# Patient Record
Sex: Male | Born: 1950 | Race: White | Hispanic: No | Smoking: Current every day smoker
Health system: Southern US, Community
[De-identification: ages and names within clinical notes are randomized; demographics above are authoritative.]

## PROBLEM LIST (undated history)

## (undated) DIAGNOSIS — J449 Chronic obstructive pulmonary disease, unspecified: Secondary | ICD-10-CM

## (undated) DIAGNOSIS — K449 Diaphragmatic hernia without obstruction or gangrene: Secondary | ICD-10-CM

## (undated) DIAGNOSIS — M5030 Other cervical disc degeneration, unspecified cervical region: Secondary | ICD-10-CM

## (undated) DIAGNOSIS — I10 Essential (primary) hypertension: Secondary | ICD-10-CM

## (undated) DIAGNOSIS — G40909 Epilepsy, unspecified, not intractable, without status epilepticus: Secondary | ICD-10-CM

## (undated) DIAGNOSIS — D649 Anemia, unspecified: Secondary | ICD-10-CM

## (undated) DIAGNOSIS — G2581 Restless legs syndrome: Secondary | ICD-10-CM

## (undated) DIAGNOSIS — M545 Low back pain, unspecified: Secondary | ICD-10-CM

## (undated) DIAGNOSIS — E871 Hypo-osmolality and hyponatremia: Secondary | ICD-10-CM

## (undated) DIAGNOSIS — K219 Gastro-esophageal reflux disease without esophagitis: Secondary | ICD-10-CM

## (undated) DIAGNOSIS — R159 Full incontinence of feces: Secondary | ICD-10-CM

## (undated) DIAGNOSIS — R569 Unspecified convulsions: Secondary | ICD-10-CM

## (undated) DIAGNOSIS — G459 Transient cerebral ischemic attack, unspecified: Secondary | ICD-10-CM

## (undated) DIAGNOSIS — F1010 Alcohol abuse, uncomplicated: Secondary | ICD-10-CM

## (undated) HISTORY — DX: Other cervical disc degeneration, unspecified cervical region: M50.30

## (undated) HISTORY — DX: Chronic obstructive pulmonary disease, unspecified: J44.9

## (undated) HISTORY — DX: Anemia, unspecified: D64.9

## (undated) HISTORY — DX: Low back pain: M54.5

## (undated) HISTORY — DX: Diaphragmatic hernia without obstruction or gangrene: K44.9

## (undated) HISTORY — PX: LUMBAR LAMINECTOMY/DECOMPRESSION MICRODISCECTOMY: SHX5026

## (undated) HISTORY — PX: BRAIN SURGERY: SHX531

## (undated) HISTORY — DX: Unspecified convulsions: R56.9

## (undated) HISTORY — DX: Gastro-esophageal reflux disease without esophagitis: K21.9

## (undated) HISTORY — DX: Hypo-osmolality and hyponatremia: E87.1

## (undated) HISTORY — DX: Full incontinence of feces: R15.9

## (undated) HISTORY — DX: Alcohol abuse, uncomplicated: F10.10

## (undated) HISTORY — PX: ROTATOR CUFF REPAIR: SHX139

## (undated) HISTORY — DX: Transient cerebral ischemic attack, unspecified: G45.9

## (undated) HISTORY — DX: Epilepsy, unspecified, not intractable, without status epilepticus: G40.909

## (undated) HISTORY — DX: Low back pain, unspecified: M54.50

## (undated) HISTORY — DX: Essential (primary) hypertension: I10

## (undated) HISTORY — DX: Restless legs syndrome: G25.81

---

## 1995-02-28 HISTORY — PX: INGUINAL HERNIA REPAIR: SUR1180

## 1997-04-12 ENCOUNTER — Ambulatory Visit (HOSPITAL_COMMUNITY): Admission: RE | Admit: 1997-04-12 | Discharge: 1997-04-12 | Payer: Self-pay | Admitting: Family Medicine

## 1997-04-22 ENCOUNTER — Ambulatory Visit (HOSPITAL_COMMUNITY): Admission: RE | Admit: 1997-04-22 | Discharge: 1997-04-22 | Payer: Self-pay | Admitting: Family Medicine

## 1997-06-19 ENCOUNTER — Other Ambulatory Visit: Admission: RE | Admit: 1997-06-19 | Discharge: 1997-06-19 | Payer: Self-pay | Admitting: Family Medicine

## 1997-09-14 ENCOUNTER — Inpatient Hospital Stay (HOSPITAL_COMMUNITY): Admission: EM | Admit: 1997-09-14 | Discharge: 1997-09-16 | Payer: Self-pay | Admitting: Emergency Medicine

## 1997-10-07 ENCOUNTER — Ambulatory Visit (HOSPITAL_COMMUNITY): Admission: RE | Admit: 1997-10-07 | Discharge: 1997-10-07 | Payer: Self-pay | Admitting: *Deleted

## 1998-01-20 ENCOUNTER — Encounter: Payer: Self-pay | Admitting: Family Medicine

## 1998-01-20 ENCOUNTER — Ambulatory Visit (HOSPITAL_COMMUNITY): Admission: RE | Admit: 1998-01-20 | Discharge: 1998-01-20 | Payer: Self-pay | Admitting: Family Medicine

## 1998-02-06 ENCOUNTER — Ambulatory Visit (HOSPITAL_COMMUNITY): Admission: RE | Admit: 1998-02-06 | Discharge: 1998-02-06 | Payer: Self-pay | Admitting: Internal Medicine

## 1998-02-06 ENCOUNTER — Encounter: Payer: Self-pay | Admitting: Internal Medicine

## 1998-03-16 ENCOUNTER — Ambulatory Visit (HOSPITAL_COMMUNITY): Admission: RE | Admit: 1998-03-16 | Discharge: 1998-03-16 | Payer: Self-pay | Admitting: Family Medicine

## 1998-03-16 ENCOUNTER — Encounter: Payer: Self-pay | Admitting: Family Medicine

## 1998-03-26 ENCOUNTER — Ambulatory Visit (HOSPITAL_COMMUNITY): Admission: RE | Admit: 1998-03-26 | Discharge: 1998-03-26 | Payer: Self-pay | Admitting: Family Medicine

## 1998-03-26 ENCOUNTER — Encounter: Payer: Self-pay | Admitting: Family Medicine

## 1998-04-21 ENCOUNTER — Encounter: Admission: RE | Admit: 1998-04-21 | Discharge: 1998-07-20 | Payer: Self-pay

## 1999-01-16 ENCOUNTER — Ambulatory Visit (HOSPITAL_COMMUNITY): Admission: RE | Admit: 1999-01-16 | Discharge: 1999-01-16 | Payer: Self-pay | Admitting: Internal Medicine

## 1999-01-16 ENCOUNTER — Encounter: Payer: Self-pay | Admitting: Internal Medicine

## 1999-01-19 ENCOUNTER — Ambulatory Visit (HOSPITAL_COMMUNITY): Admission: RE | Admit: 1999-01-19 | Discharge: 1999-01-19 | Payer: Self-pay | Admitting: *Deleted

## 1999-01-23 ENCOUNTER — Ambulatory Visit (HOSPITAL_COMMUNITY): Admission: RE | Admit: 1999-01-23 | Discharge: 1999-01-23 | Payer: Self-pay | Admitting: *Deleted

## 1999-02-03 ENCOUNTER — Encounter: Admission: RE | Admit: 1999-02-03 | Discharge: 1999-02-23 | Payer: Self-pay | Admitting: Orthopedic Surgery

## 1999-02-28 ENCOUNTER — Emergency Department (HOSPITAL_COMMUNITY): Admission: EM | Admit: 1999-02-28 | Discharge: 1999-02-28 | Payer: Self-pay | Admitting: Emergency Medicine

## 1999-02-28 ENCOUNTER — Encounter: Payer: Self-pay | Admitting: Emergency Medicine

## 2001-06-17 ENCOUNTER — Emergency Department (HOSPITAL_COMMUNITY): Admission: EM | Admit: 2001-06-17 | Discharge: 2001-06-17 | Payer: Self-pay | Admitting: Emergency Medicine

## 2002-03-04 ENCOUNTER — Encounter: Payer: Self-pay | Admitting: Family Medicine

## 2002-03-04 ENCOUNTER — Ambulatory Visit (HOSPITAL_COMMUNITY): Admission: RE | Admit: 2002-03-04 | Discharge: 2002-03-04 | Payer: Self-pay | Admitting: Family Medicine

## 2002-04-17 ENCOUNTER — Ambulatory Visit (HOSPITAL_COMMUNITY): Admission: RE | Admit: 2002-04-17 | Discharge: 2002-04-17 | Payer: Self-pay | Admitting: Family Medicine

## 2002-04-23 ENCOUNTER — Encounter: Payer: Self-pay | Admitting: Neurological Surgery

## 2002-04-23 ENCOUNTER — Ambulatory Visit (HOSPITAL_COMMUNITY): Admission: RE | Admit: 2002-04-23 | Discharge: 2002-04-24 | Payer: Self-pay | Admitting: Neurological Surgery

## 2002-05-31 ENCOUNTER — Ambulatory Visit (HOSPITAL_COMMUNITY): Admission: RE | Admit: 2002-05-31 | Discharge: 2002-05-31 | Payer: Self-pay | Admitting: Family Medicine

## 2002-07-16 ENCOUNTER — Encounter: Payer: Self-pay | Admitting: Neurological Surgery

## 2002-07-16 ENCOUNTER — Ambulatory Visit (HOSPITAL_COMMUNITY): Admission: RE | Admit: 2002-07-16 | Discharge: 2002-07-16 | Payer: Self-pay | Admitting: Neurological Surgery

## 2003-01-17 ENCOUNTER — Ambulatory Visit (HOSPITAL_COMMUNITY): Admission: RE | Admit: 2003-01-17 | Discharge: 2003-01-17 | Payer: Self-pay | Admitting: Neurological Surgery

## 2003-02-15 ENCOUNTER — Encounter: Admission: RE | Admit: 2003-02-15 | Discharge: 2003-02-15 | Payer: Self-pay | Admitting: Neurological Surgery

## 2003-04-24 ENCOUNTER — Emergency Department (HOSPITAL_COMMUNITY): Admission: EM | Admit: 2003-04-24 | Discharge: 2003-04-24 | Payer: Self-pay | Admitting: Emergency Medicine

## 2003-05-16 ENCOUNTER — Ambulatory Visit (HOSPITAL_COMMUNITY): Admission: RE | Admit: 2003-05-16 | Discharge: 2003-05-16 | Payer: Self-pay | Admitting: Neurological Surgery

## 2003-06-24 ENCOUNTER — Emergency Department (HOSPITAL_COMMUNITY): Admission: EM | Admit: 2003-06-24 | Discharge: 2003-06-24 | Payer: Self-pay | Admitting: Emergency Medicine

## 2003-10-28 ENCOUNTER — Ambulatory Visit: Payer: Self-pay | Admitting: Family Medicine

## 2003-11-12 ENCOUNTER — Ambulatory Visit: Payer: Self-pay | Admitting: Critical Care Medicine

## 2003-11-13 ENCOUNTER — Ambulatory Visit (HOSPITAL_BASED_OUTPATIENT_CLINIC_OR_DEPARTMENT_OTHER): Admission: RE | Admit: 2003-11-13 | Discharge: 2003-11-13 | Payer: Self-pay | Admitting: Family Medicine

## 2003-11-15 ENCOUNTER — Ambulatory Visit (HOSPITAL_COMMUNITY): Admission: RE | Admit: 2003-11-15 | Discharge: 2003-11-15 | Payer: Self-pay | Admitting: Family Medicine

## 2003-12-10 ENCOUNTER — Ambulatory Visit: Payer: Self-pay | Admitting: Family Medicine

## 2003-12-26 ENCOUNTER — Ambulatory Visit: Payer: Self-pay | Admitting: Critical Care Medicine

## 2004-03-26 ENCOUNTER — Ambulatory Visit: Payer: Self-pay | Admitting: Critical Care Medicine

## 2004-05-27 ENCOUNTER — Ambulatory Visit: Payer: Self-pay | Admitting: Critical Care Medicine

## 2004-06-17 ENCOUNTER — Ambulatory Visit: Payer: Self-pay | Admitting: Internal Medicine

## 2004-06-18 ENCOUNTER — Ambulatory Visit (HOSPITAL_COMMUNITY): Admission: RE | Admit: 2004-06-18 | Discharge: 2004-06-18 | Payer: Self-pay | Admitting: Internal Medicine

## 2004-06-30 ENCOUNTER — Ambulatory Visit: Payer: Self-pay | Admitting: Internal Medicine

## 2004-07-29 ENCOUNTER — Ambulatory Visit: Payer: Self-pay | Admitting: Critical Care Medicine

## 2004-08-04 ENCOUNTER — Ambulatory Visit: Payer: Self-pay | Admitting: Family Medicine

## 2004-08-26 ENCOUNTER — Ambulatory Visit (HOSPITAL_COMMUNITY): Admission: RE | Admit: 2004-08-26 | Discharge: 2004-08-26 | Payer: Self-pay | Admitting: Neurological Surgery

## 2004-09-09 ENCOUNTER — Ambulatory Visit: Payer: Self-pay | Admitting: Critical Care Medicine

## 2004-09-28 ENCOUNTER — Ambulatory Visit: Payer: Self-pay | Admitting: Physical Medicine & Rehabilitation

## 2004-09-28 ENCOUNTER — Inpatient Hospital Stay (HOSPITAL_COMMUNITY): Admission: RE | Admit: 2004-09-28 | Discharge: 2004-10-05 | Payer: Self-pay | Admitting: Neurological Surgery

## 2004-10-02 ENCOUNTER — Ambulatory Visit: Payer: Self-pay | Admitting: Critical Care Medicine

## 2004-11-09 ENCOUNTER — Ambulatory Visit: Payer: Self-pay | Admitting: Critical Care Medicine

## 2005-02-16 ENCOUNTER — Ambulatory Visit: Payer: Self-pay | Admitting: Critical Care Medicine

## 2005-03-01 ENCOUNTER — Ambulatory Visit: Payer: Self-pay | Admitting: Family Medicine

## 2005-03-10 ENCOUNTER — Ambulatory Visit: Payer: Self-pay | Admitting: Family Medicine

## 2005-04-12 ENCOUNTER — Ambulatory Visit: Payer: Self-pay | Admitting: Family Medicine

## 2005-05-17 ENCOUNTER — Ambulatory Visit: Payer: Self-pay | Admitting: Critical Care Medicine

## 2005-07-09 ENCOUNTER — Ambulatory Visit: Payer: Self-pay | Admitting: Family Medicine

## 2005-07-16 ENCOUNTER — Ambulatory Visit: Payer: Self-pay | Admitting: Critical Care Medicine

## 2005-07-27 ENCOUNTER — Ambulatory Visit: Payer: Self-pay | Admitting: Family Medicine

## 2005-07-30 ENCOUNTER — Ambulatory Visit: Payer: Self-pay | Admitting: Critical Care Medicine

## 2005-08-12 ENCOUNTER — Ambulatory Visit: Payer: Self-pay | Admitting: Family Medicine

## 2005-08-24 ENCOUNTER — Ambulatory Visit: Payer: Self-pay | Admitting: Family Medicine

## 2005-09-27 ENCOUNTER — Ambulatory Visit: Payer: Self-pay | Admitting: Cardiovascular Disease

## 2005-09-28 ENCOUNTER — Ambulatory Visit: Payer: Self-pay | Admitting: Critical Care Medicine

## 2005-10-05 ENCOUNTER — Ambulatory Visit: Payer: Self-pay

## 2005-10-20 ENCOUNTER — Ambulatory Visit: Payer: Self-pay | Admitting: Critical Care Medicine

## 2005-11-03 ENCOUNTER — Ambulatory Visit: Payer: Self-pay | Admitting: Critical Care Medicine

## 2005-12-30 ENCOUNTER — Ambulatory Visit: Payer: Self-pay | Admitting: Critical Care Medicine

## 2006-03-30 ENCOUNTER — Ambulatory Visit: Payer: Self-pay | Admitting: Critical Care Medicine

## 2006-05-09 ENCOUNTER — Ambulatory Visit: Payer: Self-pay | Admitting: Family Medicine

## 2006-07-13 ENCOUNTER — Ambulatory Visit: Payer: Self-pay | Admitting: Critical Care Medicine

## 2006-08-20 ENCOUNTER — Encounter: Admission: RE | Admit: 2006-08-20 | Discharge: 2006-08-20 | Payer: Self-pay | Admitting: Neurological Surgery

## 2006-08-31 ENCOUNTER — Encounter (INDEPENDENT_AMBULATORY_CARE_PROVIDER_SITE_OTHER): Payer: Self-pay | Admitting: Family Medicine

## 2006-09-15 ENCOUNTER — Ambulatory Visit: Payer: Self-pay | Admitting: Critical Care Medicine

## 2006-09-15 DIAGNOSIS — Z9889 Other specified postprocedural states: Secondary | ICD-10-CM

## 2006-09-15 DIAGNOSIS — J42 Unspecified chronic bronchitis: Secondary | ICD-10-CM | POA: Insufficient documentation

## 2006-09-15 DIAGNOSIS — Z8679 Personal history of other diseases of the circulatory system: Secondary | ICD-10-CM | POA: Insufficient documentation

## 2006-09-15 DIAGNOSIS — M810 Age-related osteoporosis without current pathological fracture: Secondary | ICD-10-CM | POA: Insufficient documentation

## 2006-09-15 DIAGNOSIS — H60509 Unspecified acute noninfective otitis externa, unspecified ear: Secondary | ICD-10-CM

## 2006-09-15 DIAGNOSIS — K219 Gastro-esophageal reflux disease without esophagitis: Secondary | ICD-10-CM | POA: Insufficient documentation

## 2006-09-15 DIAGNOSIS — G40909 Epilepsy, unspecified, not intractable, without status epilepticus: Secondary | ICD-10-CM

## 2006-09-15 DIAGNOSIS — F1021 Alcohol dependence, in remission: Secondary | ICD-10-CM

## 2006-09-15 DIAGNOSIS — K449 Diaphragmatic hernia without obstruction or gangrene: Secondary | ICD-10-CM | POA: Insufficient documentation

## 2006-09-15 DIAGNOSIS — I10 Essential (primary) hypertension: Secondary | ICD-10-CM

## 2006-09-15 DIAGNOSIS — G2581 Restless legs syndrome: Secondary | ICD-10-CM | POA: Insufficient documentation

## 2006-09-15 DIAGNOSIS — J449 Chronic obstructive pulmonary disease, unspecified: Secondary | ICD-10-CM

## 2006-09-16 DIAGNOSIS — M545 Low back pain: Secondary | ICD-10-CM

## 2006-10-10 ENCOUNTER — Inpatient Hospital Stay (HOSPITAL_COMMUNITY): Admission: RE | Admit: 2006-10-10 | Discharge: 2006-10-14 | Payer: Self-pay | Admitting: Neurological Surgery

## 2006-10-14 ENCOUNTER — Telehealth (INDEPENDENT_AMBULATORY_CARE_PROVIDER_SITE_OTHER): Payer: Self-pay | Admitting: *Deleted

## 2006-10-24 ENCOUNTER — Ambulatory Visit: Payer: Self-pay | Admitting: Critical Care Medicine

## 2006-10-27 ENCOUNTER — Encounter (INDEPENDENT_AMBULATORY_CARE_PROVIDER_SITE_OTHER): Payer: Self-pay | Admitting: Family Medicine

## 2006-11-04 ENCOUNTER — Encounter (INDEPENDENT_AMBULATORY_CARE_PROVIDER_SITE_OTHER): Payer: Self-pay | Admitting: Family Medicine

## 2006-11-07 ENCOUNTER — Ambulatory Visit (HOSPITAL_COMMUNITY): Admission: RE | Admit: 2006-11-07 | Discharge: 2006-11-07 | Payer: Self-pay | Admitting: Neurological Surgery

## 2006-11-07 ENCOUNTER — Ambulatory Visit: Payer: Self-pay | Admitting: Surgery

## 2006-11-07 ENCOUNTER — Encounter (INDEPENDENT_AMBULATORY_CARE_PROVIDER_SITE_OTHER): Payer: Self-pay | Admitting: Neurological Surgery

## 2006-11-09 ENCOUNTER — Telehealth (INDEPENDENT_AMBULATORY_CARE_PROVIDER_SITE_OTHER): Payer: Self-pay | Admitting: Family Medicine

## 2006-11-10 ENCOUNTER — Encounter (INDEPENDENT_AMBULATORY_CARE_PROVIDER_SITE_OTHER): Payer: Self-pay | Admitting: Family Medicine

## 2006-11-25 ENCOUNTER — Encounter (INDEPENDENT_AMBULATORY_CARE_PROVIDER_SITE_OTHER): Payer: Self-pay | Admitting: Family Medicine

## 2006-12-23 ENCOUNTER — Encounter (INDEPENDENT_AMBULATORY_CARE_PROVIDER_SITE_OTHER): Payer: Self-pay | Admitting: Family Medicine

## 2007-01-31 ENCOUNTER — Ambulatory Visit: Payer: Self-pay | Admitting: Critical Care Medicine

## 2007-02-07 ENCOUNTER — Encounter (INDEPENDENT_AMBULATORY_CARE_PROVIDER_SITE_OTHER): Payer: Self-pay | Admitting: Family Medicine

## 2007-02-17 ENCOUNTER — Telehealth (INDEPENDENT_AMBULATORY_CARE_PROVIDER_SITE_OTHER): Payer: Self-pay | Admitting: Nurse Practitioner

## 2007-02-27 ENCOUNTER — Telehealth (INDEPENDENT_AMBULATORY_CARE_PROVIDER_SITE_OTHER): Payer: Self-pay | Admitting: Family Medicine

## 2007-03-01 ENCOUNTER — Encounter (INDEPENDENT_AMBULATORY_CARE_PROVIDER_SITE_OTHER): Payer: Self-pay | Admitting: Family Medicine

## 2007-04-11 ENCOUNTER — Ambulatory Visit: Payer: Self-pay | Admitting: Critical Care Medicine

## 2007-04-12 ENCOUNTER — Encounter (INDEPENDENT_AMBULATORY_CARE_PROVIDER_SITE_OTHER): Payer: Self-pay | Admitting: Family Medicine

## 2007-04-27 ENCOUNTER — Encounter: Admission: RE | Admit: 2007-04-27 | Discharge: 2007-04-27 | Payer: Self-pay | Admitting: Neurological Surgery

## 2007-05-22 ENCOUNTER — Ambulatory Visit: Payer: Self-pay | Admitting: Family Medicine

## 2007-05-22 DIAGNOSIS — R159 Full incontinence of feces: Secondary | ICD-10-CM | POA: Insufficient documentation

## 2007-05-22 LAB — CONVERTED CEMR LAB
ALT: 11 units/L (ref 0–53)
Albumin: 4.2 g/dL (ref 3.5–5.2)
Basophils Absolute: 0 10*3/uL (ref 0.0–0.1)
CO2: 23 meq/L (ref 19–32)
Chloride: 97 meq/L (ref 96–112)
Eosinophils Relative: 4 % (ref 0–5)
Glucose, Bld: 72 mg/dL (ref 70–99)
HCT: 39.8 % (ref 39.0–52.0)
LDL Cholesterol: 107 mg/dL — ABNORMAL HIGH (ref 0–99)
Lymphs Abs: 2.1 10*3/uL (ref 0.7–4.0)
Monocytes Absolute: 1.4 10*3/uL — ABNORMAL HIGH (ref 0.1–1.0)
Monocytes Relative: 16 % — ABNORMAL HIGH (ref 3–12)
Neutrophils Relative %: 55 % (ref 43–77)
Phenytoin Lvl: 11.8 ug/mL (ref 10.0–20.0)
TSH: 1.812 microintl units/mL (ref 0.350–5.50)
Total CHOL/HDL Ratio: 3.1
Triglycerides: 56 mg/dL (ref <150)
VLDL: 11 mg/dL (ref 0–40)

## 2007-05-24 ENCOUNTER — Telehealth (INDEPENDENT_AMBULATORY_CARE_PROVIDER_SITE_OTHER): Payer: Self-pay | Admitting: *Deleted

## 2007-05-26 ENCOUNTER — Encounter (INDEPENDENT_AMBULATORY_CARE_PROVIDER_SITE_OTHER): Payer: Self-pay | Admitting: Family Medicine

## 2007-05-30 ENCOUNTER — Telehealth (INDEPENDENT_AMBULATORY_CARE_PROVIDER_SITE_OTHER): Payer: Self-pay | Admitting: Family Medicine

## 2007-06-08 ENCOUNTER — Telehealth (INDEPENDENT_AMBULATORY_CARE_PROVIDER_SITE_OTHER): Payer: Self-pay | Admitting: Family Medicine

## 2007-06-13 ENCOUNTER — Encounter (INDEPENDENT_AMBULATORY_CARE_PROVIDER_SITE_OTHER): Payer: Self-pay | Admitting: Family Medicine

## 2007-06-29 ENCOUNTER — Encounter (INDEPENDENT_AMBULATORY_CARE_PROVIDER_SITE_OTHER): Payer: Self-pay | Admitting: Family Medicine

## 2007-08-01 ENCOUNTER — Encounter (INDEPENDENT_AMBULATORY_CARE_PROVIDER_SITE_OTHER): Payer: Self-pay | Admitting: Family Medicine

## 2007-08-22 ENCOUNTER — Telehealth (INDEPENDENT_AMBULATORY_CARE_PROVIDER_SITE_OTHER): Payer: Self-pay | Admitting: Family Medicine

## 2007-08-29 ENCOUNTER — Ambulatory Visit (HOSPITAL_COMMUNITY): Admission: RE | Admit: 2007-08-29 | Discharge: 2007-08-29 | Payer: Self-pay | Admitting: Neurological Surgery

## 2007-09-01 ENCOUNTER — Encounter (INDEPENDENT_AMBULATORY_CARE_PROVIDER_SITE_OTHER): Payer: Self-pay | Admitting: Family Medicine

## 2007-10-13 ENCOUNTER — Encounter (INDEPENDENT_AMBULATORY_CARE_PROVIDER_SITE_OTHER): Payer: Self-pay | Admitting: Family Medicine

## 2007-10-18 ENCOUNTER — Ambulatory Visit: Payer: Self-pay | Admitting: Critical Care Medicine

## 2008-01-16 ENCOUNTER — Encounter (INDEPENDENT_AMBULATORY_CARE_PROVIDER_SITE_OTHER): Payer: Self-pay | Admitting: Family Medicine

## 2008-01-29 ENCOUNTER — Telehealth (INDEPENDENT_AMBULATORY_CARE_PROVIDER_SITE_OTHER): Payer: Self-pay | Admitting: Family Medicine

## 2008-01-29 ENCOUNTER — Encounter: Payer: Self-pay | Admitting: Critical Care Medicine

## 2008-03-26 ENCOUNTER — Telehealth (INDEPENDENT_AMBULATORY_CARE_PROVIDER_SITE_OTHER): Payer: Self-pay | Admitting: *Deleted

## 2008-04-02 ENCOUNTER — Encounter: Payer: Self-pay | Admitting: Critical Care Medicine

## 2008-04-09 ENCOUNTER — Telehealth (INDEPENDENT_AMBULATORY_CARE_PROVIDER_SITE_OTHER): Payer: Self-pay | Admitting: *Deleted

## 2008-04-22 ENCOUNTER — Ambulatory Visit: Payer: Self-pay | Admitting: Critical Care Medicine

## 2008-04-29 DIAGNOSIS — E871 Hypo-osmolality and hyponatremia: Secondary | ICD-10-CM | POA: Insufficient documentation

## 2008-04-29 DIAGNOSIS — D62 Acute posthemorrhagic anemia: Secondary | ICD-10-CM

## 2008-07-13 ENCOUNTER — Emergency Department (HOSPITAL_COMMUNITY): Admission: EM | Admit: 2008-07-13 | Discharge: 2008-07-14 | Payer: Self-pay | Admitting: Emergency Medicine

## 2008-07-19 ENCOUNTER — Encounter: Admission: RE | Admit: 2008-07-19 | Discharge: 2008-07-19 | Payer: Self-pay | Admitting: Anesthesiology

## 2008-08-05 ENCOUNTER — Encounter: Payer: Self-pay | Admitting: Physician Assistant

## 2008-08-05 ENCOUNTER — Encounter: Admission: RE | Admit: 2008-08-05 | Discharge: 2008-08-05 | Payer: Self-pay | Admitting: Anesthesiology

## 2008-10-10 ENCOUNTER — Ambulatory Visit: Payer: Self-pay | Admitting: Physician Assistant

## 2008-10-10 DIAGNOSIS — M79609 Pain in unspecified limb: Secondary | ICD-10-CM

## 2008-10-11 ENCOUNTER — Encounter: Payer: Self-pay | Admitting: Physician Assistant

## 2008-10-25 ENCOUNTER — Telehealth: Payer: Self-pay | Admitting: Physician Assistant

## 2008-10-30 ENCOUNTER — Encounter (INDEPENDENT_AMBULATORY_CARE_PROVIDER_SITE_OTHER): Payer: Self-pay | Admitting: *Deleted

## 2008-10-31 ENCOUNTER — Telehealth: Payer: Self-pay | Admitting: Physician Assistant

## 2008-11-11 ENCOUNTER — Ambulatory Visit: Payer: Self-pay | Admitting: Critical Care Medicine

## 2008-11-18 ENCOUNTER — Telehealth (INDEPENDENT_AMBULATORY_CARE_PROVIDER_SITE_OTHER): Payer: Self-pay | Admitting: *Deleted

## 2008-11-21 ENCOUNTER — Encounter: Payer: Self-pay | Admitting: Physician Assistant

## 2008-11-25 ENCOUNTER — Telehealth: Payer: Self-pay | Admitting: Physician Assistant

## 2008-11-25 ENCOUNTER — Encounter: Payer: Self-pay | Admitting: Physician Assistant

## 2009-01-22 ENCOUNTER — Ambulatory Visit: Payer: Self-pay | Admitting: Physician Assistant

## 2009-01-23 ENCOUNTER — Telehealth: Payer: Self-pay | Admitting: Physician Assistant

## 2009-01-31 ENCOUNTER — Encounter: Payer: Self-pay | Admitting: Physician Assistant

## 2009-02-20 ENCOUNTER — Telehealth: Payer: Self-pay | Admitting: Physician Assistant

## 2009-02-28 ENCOUNTER — Telehealth: Payer: Self-pay | Admitting: Physician Assistant

## 2009-03-07 ENCOUNTER — Telehealth: Payer: Self-pay | Admitting: Physician Assistant

## 2009-03-07 ENCOUNTER — Ambulatory Visit: Payer: Self-pay | Admitting: Critical Care Medicine

## 2009-06-16 ENCOUNTER — Ambulatory Visit: Payer: Self-pay | Admitting: Physician Assistant

## 2009-06-16 DIAGNOSIS — R82998 Other abnormal findings in urine: Secondary | ICD-10-CM | POA: Insufficient documentation

## 2009-06-18 ENCOUNTER — Telehealth: Payer: Self-pay | Admitting: Physician Assistant

## 2009-06-19 ENCOUNTER — Ambulatory Visit (HOSPITAL_COMMUNITY): Admission: RE | Admit: 2009-06-19 | Discharge: 2009-06-19 | Payer: Self-pay | Admitting: Internal Medicine

## 2009-06-23 ENCOUNTER — Ambulatory Visit (HOSPITAL_COMMUNITY): Admission: RE | Admit: 2009-06-23 | Discharge: 2009-06-23 | Payer: Self-pay | Admitting: Internal Medicine

## 2009-06-27 DIAGNOSIS — M5020 Other cervical disc displacement, unspecified cervical region: Secondary | ICD-10-CM | POA: Insufficient documentation

## 2009-07-02 ENCOUNTER — Encounter: Payer: Self-pay | Admitting: Physician Assistant

## 2009-07-03 ENCOUNTER — Ambulatory Visit: Payer: Self-pay | Admitting: Physician Assistant

## 2009-07-03 ENCOUNTER — Telehealth: Payer: Self-pay | Admitting: Physician Assistant

## 2009-07-14 ENCOUNTER — Telehealth (INDEPENDENT_AMBULATORY_CARE_PROVIDER_SITE_OTHER): Payer: Self-pay | Admitting: *Deleted

## 2009-07-23 ENCOUNTER — Telehealth: Payer: Self-pay | Admitting: Physician Assistant

## 2009-08-06 ENCOUNTER — Encounter: Payer: Self-pay | Admitting: Physician Assistant

## 2009-08-13 ENCOUNTER — Telehealth: Payer: Self-pay | Admitting: Physician Assistant

## 2009-08-20 ENCOUNTER — Encounter: Payer: Self-pay | Admitting: Physician Assistant

## 2009-08-22 ENCOUNTER — Encounter: Payer: Self-pay | Admitting: Physician Assistant

## 2009-08-22 LAB — CONVERTED CEMR LAB
ALT: 12 units/L
Alkaline Phosphatase: 107 units/L
CO2: 19 meq/L
Creatinine, Ser: 0.65 mg/dL
GFR calc non Af Amer: 106 mL/min
Glucose, Bld: 68 mg/dL
HCT: 38.4 %
Hemoglobin: 13.7 g/dL
Monocytes Absolute: 3 10*3/uL
Potassium: 4.1 meq/L
RBC: 4.22 M/uL
Sodium: 131 meq/L
WBC: 5.8 10*3/uL

## 2009-08-27 ENCOUNTER — Encounter: Payer: Self-pay | Admitting: Physician Assistant

## 2009-09-01 ENCOUNTER — Encounter: Payer: Self-pay | Admitting: Critical Care Medicine

## 2009-09-02 ENCOUNTER — Ambulatory Visit: Payer: Self-pay | Admitting: Critical Care Medicine

## 2009-09-03 ENCOUNTER — Telehealth (INDEPENDENT_AMBULATORY_CARE_PROVIDER_SITE_OTHER): Payer: Self-pay | Admitting: *Deleted

## 2009-09-10 ENCOUNTER — Encounter: Payer: Self-pay | Admitting: Physician Assistant

## 2009-09-11 ENCOUNTER — Telehealth (INDEPENDENT_AMBULATORY_CARE_PROVIDER_SITE_OTHER): Payer: Self-pay | Admitting: *Deleted

## 2009-09-12 ENCOUNTER — Telehealth (INDEPENDENT_AMBULATORY_CARE_PROVIDER_SITE_OTHER): Payer: Self-pay | Admitting: *Deleted

## 2010-01-12 ENCOUNTER — Telehealth (INDEPENDENT_AMBULATORY_CARE_PROVIDER_SITE_OTHER): Payer: Self-pay | Admitting: Nurse Practitioner

## 2010-02-01 LAB — CONVERTED CEMR LAB
ALT: 9 units/L (ref 0–53)
Alkaline Phosphatase: 107 units/L (ref 39–117)
Basophils Absolute: 0 10*3/uL (ref 0.0–0.1)
Basophils Relative: 0 % (ref 0–1)
Basophils Relative: 1 % (ref 0–1)
Bilirubin Urine: NEGATIVE
CO2: 25 meq/L (ref 19–32)
Calcium, Total (PTH): 9 mg/dL (ref 8.4–10.5)
Calcium: 8.8 mg/dL (ref 8.4–10.5)
Calcium: 9 mg/dL (ref 8.4–10.5)
Calcium: 9 mg/dL (ref 8.4–10.5)
Creatinine, Ser: 0.67 mg/dL (ref 0.40–1.50)
Creatinine, Ser: 0.7 mg/dL (ref 0.40–1.50)
Eosinophils Absolute: 0.4 10*3/uL (ref 0.0–0.7)
Eosinophils Relative: 3 % (ref 0–5)
Eosinophils Relative: 4 % (ref 0–5)
Glucose, Bld: 61 mg/dL — ABNORMAL LOW (ref 70–99)
Glucose, Bld: 79 mg/dL (ref 70–99)
Glucose, Urine, Semiquant: NEGATIVE
Ketones, urine, test strip: NEGATIVE
Lymphocytes Relative: 26 % (ref 12–46)
Lymphocytes Relative: 38 % (ref 12–46)
Lymphs Abs: 2.4 10*3/uL (ref 0.7–4.0)
MCV: 93.5 fL (ref 78.0–100.0)
Monocytes Absolute: 1.1 10*3/uL — ABNORMAL HIGH (ref 0.1–1.0)
Monocytes Relative: 14 % — ABNORMAL HIGH (ref 3–12)
Neutrophils Relative %: 42 % — ABNORMAL LOW (ref 43–77)
PTH: 39.4 pg/mL (ref 14.0–72.0)
Phenytoin Lvl: 18.8 ug/mL (ref 10.0–20.0)
Platelets: 254 10*3/uL (ref 150–400)
Sodium: 130 meq/L — ABNORMAL LOW (ref 135–145)
Sodium: 133 meq/L — ABNORMAL LOW (ref 135–145)
Sodium: 134 meq/L — ABNORMAL LOW (ref 135–145)
Specific Gravity, Urine: <1.005
TSH: 1.915 microintl units/mL (ref 0.350–4.500)
Total Bilirubin: 0.3 mg/dL (ref 0.3–1.2)
Urobilinogen, UA: 0.2

## 2010-02-05 NOTE — Assessment & Plan Note (Signed)
Summary: Pulmonary OV   Primary Provider/Referring Provider:  Dr. Luciana Axe  CC:  4 month follow up.  states breathing is worse-having incr SOB with activity and at rest x72mo.  occasional wheezing and denies chest tightness and cough.  needs to discuss xopenex HFA usage.  [name]  History of Present Illness:  Pulmonary OV  This is a 60 year old, white male with chronic obstructive lung disease and asthmatic bronchitic component.     March 07, 2009 3:53 PM No new issues.  Still smoking Pt denies any significant sore throat, nasal congestion or excess secretions, fever, chills, sweats, unintended weight loss, pleurtic or exertional chest pain, orthopnea PND, or leg swelling Pt denies any increase in rescue therapy over baseline, denies waking up needing it or having any early am or nocturnal exacerbations of coughing/wheezing/or dyspnea. March 07, 2009 3:53 PM At night does xopenex and then serevent. wheezes is more smoking   Preventive Screening-Counseling & Management  Alcohol-Tobacco     Smoking Status: current     Packs/Day: 1.5  Current Medications (verified): 1)  Plavix 75 Mg  Tabs (Clopidogrel Bisulfate) .... Take One Tablet Every Day 2)  Benazepril Hcl 20 Mg  Tabs (Benazepril Hcl) .... Take One Tablet Every Morning 3)  Furosemide 40 Mg  Tabs (Furosemide) .... Take One Tablet Twice A Day 4)  Requip 4 Mg  Tabs (Ropinirole Hcl) .... Take One Tablet By Mouth At Bedtime 5)  Dilantin 100 Mg  Caps (Phenytoin Sodium Extended) .... 5 By Mouth Once Daily 6)  Serevent Diskus 50 Mcg/dose  Aepb (Salmeterol Xinafoate) .... One Puff Two Times A Day 7)  Xopenex Hfa 45 Mcg/act Aero (Levalbuterol Tartrate) .... Two Puffs 4 Times Daily 8)  Albuterol Sulfate (2.5 Mg/19ml) 0.083%  Nebu (Albuterol Sulfate) .... One in [place] Four Times Daily As Needed 9)  Valium 5 Mg  Tabs (Diazepam) .... As Needed 10)  Omeprazole 20 Mg  Cpdr (Omeprazole) .[name].. 1 By Mouth Once Daily 11)  Morphine Sulfate 30 Mg Tabs  (Morphine Sulfate) .... Take 1 Tablet By Mouth Three Times A Day As Needed 12)  Lyrica 50 Mg Caps (Pregabalin) .... Once Daily  Allergies (verified): 1)  ! Ultram  Past History:  Past medical, surgical, family and social histories (including risk factors) reviewed, and no changes noted (except as noted below).  Past Medical History: Reviewed history from 11/25/2008 and no changes required. 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  DEXA scan 08/05/2008:  L-spine T score -2.6; Femoral neck T score -3.0    b.  patient unable to take oral bisphosphonates in past due to pain med schedule HYPERTENSION (ICD-401.9) GERD (ICD-530.81) COPD (ICD-496)  Past Surgical History: Reviewed history from 09/15/2006 and no changes required. March 1998:  T12 Fx January 2003:  Adenosine Cardiolyte May 2004:  Carotid Dopplers NL April 2004:  L5-S1: Lumbar decompression Inguinal herniorrhaphy (02/28/1995);rt Sleep Study 11/05  Family History: Reviewed history from 04/29/2008 and no changes required. Family History of Coronary Artery Disease:   Social History: Reviewed history from 01/31/2007 and no changes required. Patient is a current smoker.  Packs/Day:  1.5  Review of Systems  The patient complains of shortness of breath with activity and productive cough.  The patient denies shortness of breath at rest, non-productive cough, coughing up blood, chest pain, irregular heartbeats, acid heartburn, indigestion, loss of  appetite, weight change, abdominal pain, difficulty swallowing, sore throat, tooth/dental problems, headaches, nasal congestion/difficulty breathing through nose, sneezing, itching, ear ache, anxiety, depression, hand/feet swelling, joint stiffness or pain, rash, change in color of mucus, and fever.    Vital Signs:  Patient profile:   60 year old male Height:      72 inches Weight:      184.38 pounds BMI:     25.10 O2 Sat:      97 % on Room air Temp:     97.9 degrees F oral Pulse rate:   68 / minute BP sitting:   104 / 62  (left arm) Cuff size:   regular  Vitals Entered By: Gweneth Dimitri RN (March 07, 2009 3:32 PM)  O2 Flow:  Room air CC: 4 month follow up.  states breathing is worse-having incr SOB with activity and at rest x67mo.  occasional wheezing, denies chest tightness and cough.  needs to discuss xopenex HFA usage.   Comments Medications reviewed with patient Daytime contact number verified with patient. Gweneth Dimitri RN  March 07, 2009 3:30 PM    Physical Exam  Additional Exam:  Gen:  well nourished, in no distress ENT: no lesions, mild postnasal drip Neck: No JVD, no TMG, no carotid bruits Lungs: no use of accessory muscles, no dullness to percussion, few expiratory wheezes , prolonged coarse BS Cardiovascular: RRR, heart sounds normal, no murmurs or gallops, no peripheral edema Musculoskeletal: No deformities, no cyanosis or clubbing     Impression & Recommendations:  Problem # 1:  COPD (ICD-496) Assessment Unchanged Ongoing smoking abuse, needs to be on some type of ics plan change serevent to symbicort  Medications Added to Medication List This Visit: 1)  Symbicort 160-4.5 Mcg/act Aero (Budesonide-formoterol fumarate) .... Two puffs twice daily  Complete Medication List: 1)  Plavix 75 Mg Tabs (Clopidogrel bisulfate) .... Take one tablet every day 2)  Benazepril Hcl 20 Mg Tabs (Benazepril hcl) .... Take one tablet every morning 3)  Furosemide 40 Mg Tabs  (Furosemide) .... Take one tablet twice a day 4)  Requip 4 Mg Tabs (Ropinirole hcl) .... Take one tablet by mouth at bedtime 5)  Dilantin 100 Mg Caps (Phenytoin sodium extended) .... 5 by mouth once daily 6)  Symbicort 160-4.5 Mcg/act Aero (Budesonide-formoterol fumarate) .... Two puffs twice daily 7)  Xopenex Hfa 45 Mcg/act Aero (Levalbuterol tartrate) .... Two puffs 4 times daily 8)  Albuterol Sulfate (2.5 Mg/81ml) 0.083% Nebu (Albuterol sulfate) .... One in neb four times daily as needed 9)  Valium 5 Mg Tabs (Diazepam) .... As needed 10)  Omeprazole 20 Mg Cpdr (Omeprazole) .[name].. 1 by mouth once daily 11)  Morphine Sulfate 30 Mg Tabs (Morphine sulfate) .... Take 1 tablet by mouth three times a day as needed 12)  Lyrica 50 Mg Caps (Pregabalin) .... Once daily  Other Orders: Est. Patient Level IV (84132) HFA Instruction [phone])  Patient Instructions: 1)  Stop Serevent 2)  Start Symbicort two puff twice daily 3)  No other medication changes 4)  Return 3 months Prescriptions: SYMBICORT 160-4.5 MCG/ACT  AERO (BUDESONIDE-FORMOTEROL FUMARATE) Two puffs twice daily  #1 x 6   Entered and Authorized by:   Storm Frisk MD   Signed by:   Storm Frisk  MD on 03/07/2009   Method used:   Electronically to        [facility]. [phone]* (retail)       [phone] [address]       [place], [place]  [address]       Ph: [phone]       Fax: [phone]   RxID:   [id number]

## 2010-02-05 NOTE — Letter (Signed)
Summary: *HSN Results Follow up  [facility]  [address] [place], [place] [address]   Phone: [phone]  Fax: [phone]      01/31/2009   [facility] [address] [place], [place]  [address]   Dear  Mr. ORMAN Vermeer,                            ____S.Drinkard,FNP   ____D. Gore,FNP       ____B. McPherson,MD   ____V. Rankins,MD    ____E. Mulberry,MD    ____N. Daphine Deutscher, FNP  ____D. Reche Dixon, MD    ____K. Philipp Deputy, MD    __x__S. Alben Spittle, PA-C     This letter is to inform you that your recent test(s):  _______Pap Smear    __x_____Lab Test     _______X-ray    ____x___ is within acceptable limits  _______ requires a medication change  _______ requires a follow-up lab visit  _______ requires a follow-up visit with your provider   Comments:       _________________________________________________________ If you have any questions, please contact our office                     Sincerely,  Tereso Newcomer PA-C [facility]

## 2010-02-05 NOTE — Progress Notes (Signed)
Summary: Neurosurgery Referral and Neurology Referral   Phone Note Outgoing Call   Summary of Call: 1.  Please refer to Dr. Barnett Abu for cervical disc disease with radiculopathy.  He has seen Dr. Danielle Dess in the past. Referral letter in system.  2.  Refer to Dr. Vickey Huger at [facility]. for follow up on seizure disorder.  He tried to schedule himself, but was told he needed a referral b/c it had been a long time since he was seen there. Initial call taken by: Tereso Newcomer PA-C,  July 03, 2009 5:15 PM

## 2010-02-05 NOTE — Letter (Signed)
Summary: CMN for Nebulizer & Meds/[facility]  CMN for Nebulizer & Meds/[facility]   Imported By: Sherian Rein 09/05/2009 09:42:04  _____________________________________________________________________  External Attachment:    Type:   Image     Comment:   External Document

## 2010-02-05 NOTE — Progress Notes (Signed)
Summary: refills request   Phone Note Call from Patient Call back at [phone]   Summary of Call: The pt needs refills from ricipinerol.  [facility] [phone]) Alben Spittle PA-c Initial call taken by: Manon Hilding,  July 23, 2009 2:42 PM  Follow-up for Phone Call        Ropinirole -- sent to S. Zella Dewan. Follow-up by: Dutch Quint RN,  July 23, 2009 2:52 PM  Additional Follow-up for Phone Call Additional follow up Details #1::        Pt. notified of refill.   Additional Follow-up by: Dutch Quint RN,  July 23, 2009 3:31 PM    Prescriptions: REQUIP 4 MG  TABS (ROPINIROLE HCL) Take one tablet by mouth at bedtime  #30 Tablet x 5   Entered and Authorized by:   Tereso Newcomer PA-C   Signed by:   Tereso Newcomer PA-C on 07/23/2009   Method used:   Electronically to        [facility]. [phone]* (retail)       [phone] [address]       [place], [place]  [address]       Ph: [phone]       Fax: [phone]   RxID:   [id number]

## 2010-02-05 NOTE — Progress Notes (Signed)
   Phone Note Call from Patient Call back at [facility] Phone [phone]   Summary of Call: The pt wants to inform the provider that he will start taking today two fluids pills.  Also, pt is wondering when he is going to get his bone medication refills. [facility] [address] [phone]). Alben Spittle PA-C Initial call taken by: Manon Hilding,  February 28, 2009 3:54 PM  Follow-up for Phone Call        forward to provider.... calcium med is the med the pt is referring to Follow-up by: Arm[place] Shannon,  March 05, 2009 11:45 AM  Additional Follow-up for Phone Call Additional follow up Details #1::        should have plenty of caltrate refills at pharmacy for a couple mos  I am going to check on IV meds for osteoporosis. They are taken once a year. Once I figure out what to do, will notify patient. Additional Follow-up by: Tereso Newcomer PA-C,  March 05, 2009 12:15 PM    Additional Follow-up for Phone Call Additional follow up Details #2::    Left message on answering machine for pt to call back.Marland KitchenMarland KitchenMarland KitchenArmenia Shannon  March 06, 2009 10:10 AM   spoke with pt and he is aware... Armenia Shannon  March 06, 2009 2:17 PM

## 2010-02-05 NOTE — Assessment & Plan Note (Signed)
Summary: RETURN TO SEE [facility]OTT IN 2 WKS TO GO OVER MRI//GK   Vital Signs:  Patient profile:   60 year old male Height:      72 inches Weight:      177 pounds BMI:     24.09 Temp:     98.0 degrees F oral Pulse rate:   72 / minute Pulse rhythm:   regular Resp:     18 per minute BP sitting:   129 / 79  (left arm) Cuff size:   large  Vitals Entered By: Armenia Shannon (July 03, 2009 3:26 PM) CC: results from MRI... referral for dr. Richardean Chimera.... Is Patient Diabetic? No Pain Assessment Patient in pain? no       Does patient need assistance? Functional Status Self care Ambulation Normal   Primary Care Provider:  Tereso Newcomer PA-C  CC:  results from MRI... referral for dr. Richardean Chimera.....  History of Present Illness: Here for f/u on arm pain.  MRI: IMPRESSION:   1.  Severe left lateral recess and foraminal stenosis at C4-5 and at C6-7 due to uncinate spurs which could affect the left C5 and C7 nerve roots. 2.  Moderate cervical spinal stenosis at C4-5.  Arm pain the same.   Sent him for CXR.  Nothing acute noted.   Has seen Dr. Danielle Dess in the past for his back.   Current Medications (verified): 1)  Plavix 75 Mg  Tabs (Clopidogrel Bisulfate) .... Take One Tablet Every Day 2)  Benazepril Hcl 20 Mg  Tabs (Benazepril Hcl) .... Take One Tablet Every Morning 3)  Furosemide 40 Mg  Tabs (Furosemide) .... Take One Tablet Twice A Day 4)  Requip 4 Mg  Tabs (Ropinirole Hcl) .... Take One Tablet By Mouth At Bedtime 5)  Dilantin 100 Mg  Caps (Phenytoin Sodium Extended) .... 5 By Mouth Once Daily 6)  Symbicort 160-4.5 Mcg/act  Aero (Budesonide-Formoterol Fumarate) .... Two Puffs Twice Daily 7)  Albuterol Sulfate (2.5 Mg/65ml) 0.083%  Nebu (Albuterol Sulfate) .... One in [place] Four Times Daily As Needed 8)  Valium 5 Mg  Tabs (Diazepam) .... As Needed 9)  Omeprazole 20 Mg  Cpdr (Omeprazole) .[name].. 1 By Mouth Once Daily 10)  Morphine Sulfate 30 Mg Tabs (Morphine Sulfate) .... Take 1 Tablet By  Mouth Three Times A Day As Needed 11)  Lyrica 50 Mg Caps (Pregabalin) .... Once Daily 12)  Boniva 150 Mg Tabs (Ibandronate Sodium) .[name].. 1 By Mouth Once A Month 13)  Xopenex Hfa 45 Mcg/act Aero (Levalbuterol Tartrate) .... 2 Puffs Four Times Daily  Allergies (verified): 1)  ! Ultram  Physical Exam  General:  alert, well-developed, and well-nourished.   Head:  normocephalic and atraumatic.   Lungs:  decreased breath sounds exp wheezes and rhonchi bilat Heart:  normal rate and regular rhythm.   Neurologic:  alert & oriented X3 and cranial nerves II-XII intact.   Psych:  normally interactive.     Impression & Recommendations:  Problem # 1:  SEIZURE DISORDER (ICD-780.39)  needs referral back to Dr. Vickey Huger to get f/u for his seizures  His updated medication list for this problem includes:    Dilantin 100 Mg Caps (Phenytoin sodium extended) .Marland KitchenMarland KitchenMarland KitchenMarland Kitchen 5 by mouth once daily    Lyrica 50 Mg Caps (Pregabalin) ..... 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 Danielle Dess refer to him for further recommendations  Orders: Neurosurgeon Referral (Neurosurgeon)  Complete Medication  List: 1)  Plavix 75 Mg Tabs (Clopidogrel bisulfate) .... Take one tablet every day 2)  Benazepril Hcl 20 Mg Tabs (Benazepril hcl) .... Take one tablet every morning 3)  Furosemide 40 Mg Tabs (Furosemide) .... Take one tablet twice a day 4)  Requip 4 Mg Tabs (Ropinirole hcl) .... Take one tablet by mouth at bedtime 5)  Dilantin 100 Mg Caps (Phenytoin sodium extended) .... 5 by mouth once daily 6)  Symbicort 160-4.5 Mcg/act Aero (Budesonide-formoterol fumarate) .... Two puffs twice daily 7)  Albuterol Sulfate (2.5 Mg/88ml) 0.083% Nebu (Albuterol sulfate) .... One in neb four times daily as needed 8)  Valium 5 Mg Tabs (Diazepam) .... As needed 9)  Omeprazole 20 Mg Cpdr (Omeprazole) .[name].. 1 by mouth once daily 10)   Morphine Sulfate 30 Mg Tabs (Morphine sulfate) .... Take 1 tablet by mouth three times a day as needed 11)  Lyrica 50 Mg Caps (Pregabalin) .... Once daily 12)  Boniva 150 Mg Tabs (Ibandronate sodium) .[name].. 1 by mouth once a month 13)  Xopenex Hfa 45 Mcg/act Aero (Levalbuterol tartrate) .... 2 puffs four times daily  Patient Instructions: 1)  Please schedule a follow-up appointment in 6 months with Scott for blood pressure. 2)  Someone will call you to set up an appointment with Dr. Danielle Dess and Dr. Vickey Huger. 3)     Appended Document: RETURN TO SEE SCOTT IN 2 WKS TO GO OVER MRI//GK     Clinical Lists Changes  Problems: Assessed DEGENERATIVE DISC DISEASE, CERVICAL SPINE, W/RADICULOPATHY as comment only - eval by Dr. 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  DEXA scan 08/05/2008:  L-spine T score -2.6; Femoral neck T score -3.0    b.  patient unable to take oral bisphosphonates in past due to pain med schedule HYPERTENSION (ICD-401.9) GERD (ICD-530.81) COPD (ICD-496) Cervical DDD with radiculopathy   a. MRI done 07/2009   b. eval by Dr. Danielle Dess .. . 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 Danielle Dess 8.3.2011 cervical decompression surgery is  planned  Complete Medication List: 1)  Plavix 75 Mg Tabs (Clopidogrel bisulfate) .... Take one tablet every day 2)  Benazepril Hcl 20 Mg Tabs (Benazepril hcl) .... Take one tablet every morning 3)  Furosemide 40 Mg Tabs (Furosemide) .... Take one tablet twice a day 4)  Requip 4 Mg Tabs (Ropinirole hcl) .... Take one tablet by mouth at bedtime 5)  Dilantin 100 Mg Caps (Phenytoin sodium extended) .... 5 by mouth once daily 6)  Symbicort 160-4.5 Mcg/act Aero (Budesonide-formoterol fumarate) .... Two puffs twice daily 7)  Albuterol Sulfate (2.5 Mg/72ml) 0.083% Nebu (Albuterol sulfate) .... One in neb four times daily as needed 8)  Valium 5 Mg Tabs (Diazepam) .... As needed 9)  Omeprazole 20 Mg Cpdr (Omeprazole) .[name].. 1 by mouth once daily 10)  Morphine Sulfate 30 Mg Tabs (Morphine sulfate) .... Take 1 tablet by mouth three times a day as needed 11)  Lyrica 50 Mg Caps (Pregabalin) .... Once daily 12)  Boniva 150 Mg Tabs (Ibandronate sodium) .[name].. 1 by mouth once a month 13)  Xopenex Hfa 45 Mcg/act Aero (Levalbuterol tartrate) .... 2 puffs four times daily   Past History:  Past Medical History: HYPONATREMIA (ICD-276.1) ANEMIA, SECONDARY TO ACUTE BLOOD LOSS (ICD-285.1) INCONTINENCE OF FECES (ICD-787.6) BACK PAIN, LUMBAR, CHRONIC (ICD-724.2) ROTATOR CUFF REPAIR, LEFT, HX OF (ICD-V45.89) HERNIORRHAPHY, HX OF (ICD-V45.89) Hx of HX, PERSONAL, ALCOHOLISM (ICD-V11.3) RESTLESS LEG SYNDROME (ICD-333.94) OTITIS EXTERNA, ACUTE NEC (ICD-380.22) BRONCHITIS, CHRONIC NOS (ICD-491.9) HIATAL HERNIA (ICD-553.3) TRANSIENT ISCHEMIC ATTACK, HX OF (ICD-V12.50) SEIZURE DISORDER (ICD-780.39) OSTEOPOROSIS (ICD-733.00)    a.  DEXA scan 08/05/2008:  L-spine T score -2.6; Femoral neck T score -3.0    b.  patient unable to take oral bisphosphonates in past due to pain med schedule HYPERTENSION (ICD-401.9) GERD (ICD-530.81) COPD (ICD-496) Cervical DDD with radiculopathy   a. MRI done 07/2009   b. eval by Dr. Danielle Dess  .. . cervical decompression recommended

## 2010-02-05 NOTE — Progress Notes (Signed)
Summary: need to file med aluterol 0.083% under medicare part B  Phone Note From Pharmacy   Caller: [facility]. #[id number]* Call For: prior auth albuterol 0.083% inhal soln  Summary of Call: Per pt ins company  [employer] [phone], pharmacy is trying to file under ins and should file under Medicare Part B unless pt is in nursing home.  --Pharacist informed and refile under pt medicare part B .Kandice Hams [facility]  September 03, 2009 5:09 PM  Initial call taken by: Kandice Hams CMA,  September 03, 2009 5:09 PM

## 2010-02-05 NOTE — Letter (Signed)
Summary: RADIOLOGY REPORT  RADIOLOGY REPORT   Imported By: Arta Bruce 01/16/2009 14:19:47  _____________________________________________________________________  External Attachment:    Type:   Image     Comment:   External Document

## 2010-02-05 NOTE — Progress Notes (Signed)
Summary: appt with [facility]   Phone Note Outgoing Call   Summary of Call: make sure patient knows about appt with Dr. Anne Hahn 8.16.2011 11:00 at [facility] Initial call taken by: Tereso Newcomer PA-C,  August 13, 2009 10:46 PM  Follow-up for Phone Call        I Spoke to pt and he said that he got the paper work but he is going to try to get there he will letme know the day before if he can make t . Follow-up by: Cheryll Dessert,  August 14, 2009 2:42 PM

## 2010-02-05 NOTE — Progress Notes (Signed)
   Phone Note Outgoing Call   Summary of Call: Tell patient I have talked to physician here about how to take his meds. He can take the [facility] with his morphine if he cannot wait to take it in the morning. I believe this is why he could not take it (b/c he had to wait to take his pain meds and could not do that). So, he can take it with his morphine if needed.   Let me know if he thinks he can do this. I can talk to him when you get him on the phone. Initial call taken by: Tereso Newcomer PA-C,  March 07, 2009 5:08 PM  Follow-up for Phone Call        pt says he has not took the med in [place]...Marland KitchenMarland Kitchen  pt says he doesn't know what we are talking about Follow-up by: Armenia Shannon,  March 18, 2009 11:58 AM  Additional Follow-up for Phone Call Additional follow up Details #1::        Sandrea Hammond is the medicine he takes once a month for his bones. He told me when I first met him that he could not take it b/c he was told not to take any other medicines with it for 30 minutes.  He told me that he has to take his pain medicines first thing in the morning to be able to function. What I am saying is that he can take his pain medicines with the Boniva if he has to so that he can take it. This medicine is to keep his bones from getting thinner.  Additional Follow-up by: Tereso Newcomer PA-C,  March 18, 2009 12:44 PM    Additional Follow-up for Phone Call Additional follow up Details #2::    pt says he doesn't have rx for bonvia and that he takes twelve pills daily and he doesn;t want to take any more meds.... pt says just sent rx to pharmacy and hangs up..[facility] [address].. Armenia Shannon  March 18, 2009 12:59 PM   spoke with pt and he said he will take med... he knows that he needs it..Armenia Shannon  March 18, 2009 2:28 PM   Additional Follow-up for Phone Call Additional follow up Details #3:: Details for Additional Follow-up Action Taken: just refilled it Additional Follow-up by: Brynda Rim,  March 21, 2009 2:33 PM

## 2010-02-05 NOTE — Letter (Signed)
Summary: [facility]/APPT DATE & TIME  [facility]/APPT DATE & TIME   Imported By: Arta Bruce 08/20/2009 11:17:21  _____________________________________________________________________  External Attachment:    Type:   Image     Comment:   External Document

## 2010-02-05 NOTE — Letter (Signed)
Summary: REQUESTING RECORDS FROM [facility]  REQUESTING RECORDS FROM [facility]   Imported By: Arta Bruce 01/17/2009 11:35:24  _____________________________________________________________________  External Attachment:    Type:   Image     Comment:   External Document

## 2010-02-05 NOTE — Progress Notes (Signed)
Summary: requesting results be faxed to [facility]   Phone Note Call from Patient Call back at [phone]   Summary of Call: The pt wants the provider sent the results from EKG/ X-rays and MRI to the [facility].   Alben Spittle PA-c  Initial call taken by: Manon Hilding,  July 14, 2009 3:28 PM  Follow-up for Phone Call        FAXED TO [facility] Follow-up by: Arta Bruce,  July 16, 2009 11:33 AM

## 2010-02-05 NOTE — Progress Notes (Signed)
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 HE SAYS HE IS USING THE RESTROOM TOO OFTEN.  HE SAYS YOU TO TAKE IT YOURSELF. Initial call taken by: Leodis Rains,  February 20, 2009 3:38 PM  Follow-up for Phone Call        spoke with pt and he said he will go to bathroom all day and night when he took the furesomide twice daily so he started taking one pill daily for almost a week now and he says he doesn't go often.... pt says he is not having any problems and he feels fine... Follow-up by: Armenia Shannon,  February 21, 2009 9:07 AM  Additional Follow-up for Phone Call Additional follow up Details #1::        He does not have to take it two times a day.  He can take it once a day.  So, instead of taking one tablet two times a day, he can take 2 tablets once a day. Additional Follow-up by: Tereso Newcomer PA-C,  February 21, 2009 12:57 PM    Additional Follow-up for Phone Call Additional follow up Details #2::    pt says he doesn't think he needs two a day...Marland KitchenMarland Kitchen pt wants to know if the pill might be to strong for him...Marland KitchenMarland Kitchen pt says on one pill he still goes to the bathroom all day and night...Marland KitchenMarland Kitchen  pt says he takes the med only in the morning and he still goes all day... Armenia Shannon  February 25, 2009 2:53 PM   Additional Follow-up for Phone Call Additional follow up Details #3:: Details for Additional Follow-up Action Taken: cutting back will likely make his bp go up have him come in for a bp check in 2-3 weeks    line is busy..... Armenia Shannon  February 27, 2009 5:17 PM    spoke with pt and he said that he is not coming in for an appt.... pt says his bp is fine.... Armenia Shannon  February 27, 2009 5:36 PM   Additional Follow-up by: Tereso Newcomer PA-C,  February 25, 2009 5:16 PM

## 2010-02-05 NOTE — Assessment & Plan Note (Signed)
Summary: follow up bp check in 3 months/gk   Vital Signs:  Patient profile:   60 year old male Height:      72 inches Weight:      177 pounds BMI:     24.09 Temp:     97.6 degrees F oral Pulse rate:   80 / minute Pulse rhythm:   regular Resp:     18 per minute BP sitting:   143 / 85  (left arm) Cuff size:   regular  Vitals Entered By: Armenia Shannon (January 22, 2009 2:05 PM)  Serial Vital Signs/Assessments:  Time      Position  BP       Pulse  Resp  Temp     By 2:26 PM             122/78                         Tereso Newcomer PA-C  CC: f/u on bp, Hypertension Management, Abdominal Pain Is Patient Diabetic? No Pain Assessment Patient in pain? no       Does patient need assistance? Functional Status Self care Ambulation Normal   Primary Care Provider:  Dr. Luciana Axe  CC:  f/u on bp, Hypertension Management, and Abdominal Pain.  History of Present Illness: Here for f/u. Taking blood pressure med. Still seeing pain clinic for back pain. Still seeing pulmo for COPD. Cannot take boniva due to pain med schedule.  I tried to get a PTH to see if he needed rocaltrol.  He could not come in until today. He says he doesn't feel like going out.  Denies feeling depressed.   Dyspepsia History:      He has no alarm features of dyspepsia including no history of melena, hematochezia, dysphagia, persistent vomiting, or involuntary weight loss > 5%.  There is a prior history of GERD.    Hypertension History:      He complains of dyspnea with exertion, but denies chest pain and syncope.  He notes no problems with any antihypertensive medication side effects.  Further comments include: just smoked before coming in c/o left shoulder pain at every visit .        Positive major cardiovascular risk factors include male age 2 years old or older, hypertension, and current tobacco user.      Problems Prior to Update: 1)  Arm Pain  (ICD-729.5) 2)  Hyponatremia  (ICD-276.1) 3)  Anemia,  Secondary To Acute Blood Loss  (ICD-285.1) 4)  Incontinence of Feces  (ICD-787.6) 5)  Back Pain, Lumbar, Chronic  (ICD-724.2) 6)  Rotator Cuff Repair, Left, Hx of  (ICD-V45.89) 7)  Herniorrhaphy, Hx of  (ICD-V45.89) 8)  Hx of Hx, Personal, Alcoholism  (ICD-V11.3) 9)  Restless Leg Syndrome  (ICD-333.94) 10)  Otitis Externa, Acute Nec  (ICD-380.22) 11)  Bronchitis, Chronic Nos  (ICD-491.9) 12)  Hiatal Hernia  (ICD-553.3) 13)  Transient Ischemic Attack, Hx of  (ICD-V12.50) 14)  Seizure Disorder  (ICD-780.39) 15)  Osteoporosis  (ICD-733.00) 16)  Hypertension  (ICD-401.9) 17)  Gerd  (ICD-530.81) 18)  COPD  (ICD-496)  Current Medications (verified): 1)  Plavix 75 Mg  Tabs (Clopidogrel Bisulfate) .... Take One Tablet Every Day 2)  Benazepril Hcl 20 Mg  Tabs (Benazepril Hcl) .... Take One Tablet Every Morning 3)  Furosemide 40 Mg  Tabs (Furosemide) .... Take One Tablet Twice A Day 4)  Requip 4 Mg  Tabs (Ropinirole Hcl) .... Take One  Tablet By Mouth At Bedtime 5)  Dilantin 100 Mg  Caps (Phenytoin Sodium Extended) .... 5 By Mouth Once Daily 6)  Serevent Diskus 50 Mcg/dose  Aepb (Salmeterol Xinafoate) .... One Puff Two Times A Day 7)  Xopenex Hfa 45 Mcg/act Aero (Levalbuterol Tartrate) .... Two Puffs 4 Times Daily 8)  Albuterol Sulfate (2.5 Mg/75ml) 0.083%  Nebu (Albuterol Sulfate) .... One in [place] Four Times Daily As Needed 9)  Valium 5 Mg  Tabs (Diazepam) .... As Needed 10)  Omeprazole 20 Mg  Cpdr (Omeprazole) .[name].. 1 By Mouth Once Daily 11)  Neurontin 300 Mg Caps (Gabapentin) .[name].. 1 By Mouth Every A.m., 1 By Mouth At Night and 2 By Mouth Qhs 12)  Aerochamber Mv  Misc (Spacer/aero-Holding Chambers) .... Use With Xopenex 13)  Morphine Sulfate 30 Mg Tabs (Morphine Sulfate) .... Take 1 Tablet By Mouth Three Times A Day As Needed 14)  Lyrica 50 Mg Caps (Pregabalin) .... Once Daily 15)  Caltrate 600+d 600-400 Mg-Unit Tabs (Calcium Carbonate-Vitamin D) .... Take 1 Tablet By Mouth Two Times A  Day  Allergies (verified): 1)  ! Ultram  Past History:  Past Medical History: Last updated: 11/25/2008 HYPONATREMIA (ICD-276.1) ANEMIA, SECONDARY TO ACUTE BLOOD LOSS (ICD-285.1) INCONTINENCE OF FECES (ICD-787.6) BACK PAIN, LUMBAR, CHRONIC (ICD-724.2) ROTATOR CUFF REPAIR, LEFT, HX OF (ICD-V45.89) HERNIORRHAPHY, HX OF (ICD-V45.89) Hx of HX, PERSONAL, ALCOHOLISM (ICD-V11.3) RESTLESS LEG SYNDROME (ICD-333.94) OTITIS EXTERNA, ACUTE NEC (ICD-380.22) BRONCHITIS, CHRONIC NOS (ICD-491.9) HIATAL HERNIA (ICD-553.3) TRANSIENT ISCHEMIC ATTACK, HX OF (ICD-V12.50) SEIZURE DISORDER (ICD-780.39) OSTEOPOROSIS (ICD-733.00)    a.  DEXA scan 08/05/2008:  L-spine T score -2.6; Femoral neck T score -3.0    b.  patient unable to take oral bisphosphonates in past due to pain med schedule HYPERTENSION (ICD-401.9) GERD (ICD-530.81) COPD (ICD-496)  Past Surgical History: Last updated: 09/15/2006 March 1998:  T12 Fx January 2003:  Adenosine Cardiolyte May 2004:  Carotid Dopplers NL April 2004:  L5-S1: Lumbar decompression Inguinal herniorrhaphy (02/28/1995);rt Sleep Study 11/05  Physical Exam  General:  alert, well-developed, and well-nourished.   Head:  normocephalic and atraumatic.   Lungs:  normal breath sounds, no crackles, and no wheezes.   Heart:  normal rate and regular rhythm.   Abdomen:  soft and non-tender.   Neurologic:  alert & oriented X3 and cranial nerves II-XII intact.   Psych:  normally interactive and good eye contact.     Impression & Recommendations:  Problem # 1:  HYPERTENSION (ICD-401.9)  usually well controlled repeat optimal  His updated medication list for this problem includes:    Benazepril Hcl 20 Mg Tabs (Benazepril hcl) .[name]... Take one tablet every morning    Furosemide 40 Mg Tabs (Furosemide) .[name]... Take one tablet twice a day  Orders: T-Comprehensive Metabolic Panel ([id number])  Problem # 2:  OSTEOPOROSIS (ICD-733.00)  states he cannot get another back  surgery due to osteo can't take boniva due to pain meds and COPD meds schedule d/w endocrinology .. . .check PTH to see if he just need rocaltrol  His updated medication list for this problem includes:    Caltrate 600+d 600-400 Mg-unit Tabs (Calcium carbonate-vitamin d) .[name]... Take 1 tablet by mouth two times a day  Orders: T-Parathyroid Hormone, Intact ([id number])  Problem # 3:  COPD (ICD-496) stable follow up with pulmo d/c cigs  His updated medication list for this problem includes:    Serevent Diskus 50 Mcg/dose Aepb (Salmeterol xinafoate) ..... One puff two times a day    Xopenex Hfa 45  Mcg/act Aero (Levalbuterol tartrate) .[name]..[name] Two puffs 4 times daily    Albuterol Sulfate (2.5 Mg/38ml) 0.083% Nebu (Albuterol sulfate) ..... One in neb four times daily as needed  Problem # 4:  BACK PAIN, LUMBAR, CHRONIC (ICD-724.2) f/u with pain clinic  His updated medication list for this problem includes:    Morphine Sulfate 30 Mg Tabs (Morphine sulfate) .[name]... Take 1 tablet by mouth three times a day as needed  Problem # 5:  SEIZURE DISORDER (ICD-780.39)  check dilantin level  The following medications were removed from the medication list:    Neurontin 300 Mg Caps (Gabapentin) .[name]... 1 by mouth every a.m., 1 by mouth at night and 2 by mouth qhs His updated medication list for this problem includes:    Dilantin 100 Mg Caps (Phenytoin sodium extended) .Marland KitchenMarland KitchenMarland KitchenMarland Kitchen 5 by mouth once daily    Lyrica 50 Mg Caps (Pregabalin) ..... Once daily  Orders: T-Dilantin (Phenytoin) ([id number])  Problem # 6:  TRANSIENT ISCHEMIC ATTACK, HX OF (ICD-V12.50)  check CBC with chronic plavix Tx   Orders: T-CBC w/Diff ([id number])  Problem # 7:  Preventive Health Care (ICD-V70.0) just had colo schedule CPE  Complete Medication List: 1)  Plavix 75 Mg Tabs (Clopidogrel bisulfate) .... Take one tablet every day 2)  Benazepril Hcl 20 Mg Tabs (Benazepril hcl) .... Take one tablet every morning 3)  Furosemide  40 Mg Tabs (Furosemide) .... Take one tablet twice a day 4)  Requip 4 Mg Tabs (Ropinirole hcl) .... Take one tablet by mouth at bedtime 5)  Dilantin 100 Mg Caps (Phenytoin sodium extended) .... 5 by mouth once daily 6)  Serevent Diskus 50 Mcg/dose Aepb (Salmeterol xinafoate) .... One puff two times a day 7)  Xopenex Hfa 45 Mcg/act Aero (Levalbuterol tartrate) .... Two puffs 4 times daily 8)  Albuterol Sulfate (2.5 Mg/65ml) 0.083% Nebu (Albuterol sulfate) .... One in neb four times daily as needed 9)  Valium 5 Mg Tabs (Diazepam) .... As needed 10)  Omeprazole 20 Mg Cpdr (Omeprazole) .[name].. 1 by mouth once daily 11)  Aerochamber Mv Misc (Spacer/aero-holding chambers) .... Use with xopenex 12)  Morphine Sulfate 30 Mg Tabs (Morphine sulfate) .... Take 1 tablet by mouth three times a day as needed 13)  Lyrica 50 Mg Caps (Pregabalin) .... Once daily 14)  Caltrate 600+d 600-400 Mg-unit Tabs (Calcium carbonate-vitamin d) .... Take 1 tablet by mouth two times a day  Hypertension Assessment/Plan:      The patient's hypertensive risk group is category B: At least one risk factor (excluding diabetes) with no target organ damage.  His calculated 10 year risk of coronary heart disease is 22 %.  Today's blood pressure is 143/85.  His blood pressure goal is < 140/90.  Patient Instructions: 1)  Please schedule a follow-up appointment in 4 months with Lark Runk for CPE. 2)  Get back on your calcium.

## 2010-02-05 NOTE — Letter (Signed)
Summary: [facility] [facility]  [facility] [facility]   Imported By: Arta Bruce 07/04/2009 10:40:06  _____________________________________________________________________  External Attachment:    Type:   Image     Comment:   External Document

## 2010-02-05 NOTE — Assessment & Plan Note (Signed)
Summary: 4 MONTH FU/CPE//////KT   Vital Signs:  Patient profile:   60 year old male Weight:      179.2 pounds Temp:     97.3 degrees F oral Pulse rate:   82 / minute Pulse rhythm:   regular Resp:     18 per minute BP sitting:   114 / 74  (left arm) Cuff size:   large  Vitals Entered By: Arm[place] Shannon (June 16, 2009 2:24 PM)  Serial Vital Signs/Assessments:  Time      Position  BP       Pulse  Resp  Temp     By 3:33 PM             112/70                         Tereso Newcomer PA-C 3:33 PM             120/70                         Tereso Newcomer PA-C  Comments: 3:33 PM left arm By: Tereso Newcomer PA-C  3:33 PM right arm  By: Tereso Newcomer PA-C   CC: CPE Is Patient Diabetic? No Pain Assessment Patient in pain? no       Does patient need assistance? Functional Status Self care Ambulation Normal   Primary Care Provider:  Tereso Newcomer PA-C  CC:  CPE.  History of Present Illness: Here for CPE.  Patient kept running in and out of room while waiting on me.  Never would get undressed.  Still dressed when I come in to the room.  States he only wants to talk about his arm when I come in the room.  Using profanity often.  Practice administrator asked to meet with patient to remind him of our policy on conduct.  Left arm pain:  Notes pain in left arm for years.  2-3 months ago got worse.  Feels in his chest and goes all the way down his arm into his fingers.  Does not point to any particular dermatome.  No numbness or tingling.  States it feels weak.  Pain is fairly constant.  Saw cardiology years ago and had a myoview.  Says he feels it after using his inhalers.  But, does get pain even without using inhalers.  No assoc nausea, dyspnea, syncope.  Had surgery on left shoulder years ago for rotator cuff injury.  Health maint: Had colo in 2009.  Had polyps and due for repeat in 2012.    Problems Prior to Update: 1)  Urinalysis, Abnormal  (ICD-791.9) 2)  Preventive Health  Care  (ICD-V70.0) 3)  Arm Pain  (ICD-729.5) 4)  Hyponatremia  (ICD-276.1) 5)  Anemia, Secondary To Acute Blood Loss  (ICD-285.1) 6)  Incontinence of Feces  (ICD-787.6) 7)  Back Pain, Lumbar, Chronic  (ICD-724.2) 8)  Rotator Cuff Repair, Left, Hx of  (ICD-V45.89) 9)  Herniorrhaphy, Hx of  (ICD-V45.89) 10)  Hx of Hx, Personal, Alcoholism  (ICD-V11.3) 11)  Restless Leg Syndrome  (ICD-333.94) 12)  Otitis Externa, Acute Nec  (ICD-380.22) 13)  Bronchitis, Chronic Nos  (ICD-491.9) 14)  Hiatal Hernia  (ICD-553.3) 15)  Transient Ischemic Attack, Hx of  (ICD-V12.50) 16)  Seizure Disorder  (ICD-780.39) 17)  Osteoporosis  (ICD-733.00) 18)  Hypertension  (ICD-401.9) 19)  Gerd  (ICD-530.81) 20)  COPD  (ICD-496)  Current Medications (verified): 1)  Plavix  75 Mg  Tabs (Clopidogrel Bisulfate) .... Take One Tablet Every Day 2)  Benazepril Hcl 20 Mg  Tabs (Benazepril Hcl) .... Take One Tablet Every Morning 3)  Furosemide 40 Mg  Tabs (Furosemide) .... Take One Tablet Twice A Day 4)  Requip 4 Mg  Tabs (Ropinirole Hcl) .... Take One Tablet By Mouth At Bedtime 5)  Dilantin 100 Mg  Caps (Phenytoin Sodium Extended) .... 5 By Mouth Once Daily 6)  Symbicort 160-4.5 Mcg/act  Aero (Budesonide-Formoterol Fumarate) .... Two Puffs Twice Daily 7)  Albuterol Sulfate (2.5 Mg/48ml) 0.083%  Nebu (Albuterol Sulfate) .... One in [place] Four Times Daily As Needed 8)  Valium 5 Mg  Tabs (Diazepam) .... As Needed 9)  Omeprazole 20 Mg  Cpdr (Omeprazole) .[name].. 1 By Mouth Once Daily 10)  Morphine Sulfate 30 Mg Tabs (Morphine Sulfate) .... Take 1 Tablet By Mouth Three Times A Day As Needed 11)  Lyrica 50 Mg Caps (Pregabalin) .... Once Daily 12)  Boniva 150 Mg Tabs (Ibandronate Sodium) .[name].. 1 By Mouth Once A Month 13)  Xopenex Hfa 45 Mcg/act Aero (Levalbuterol Tartrate) .... 2 Puffs Four Times Daily  Allergies (verified): 1)  ! Ultram  Past History:  Past Medical History: Last updated: 11/25/2008 HYPONATREMIA  (ICD-276.1) ANEMIA, SECONDARY TO ACUTE BLOOD LOSS (ICD-285.1) INCONTINENCE OF FECES (ICD-787.6) BACK PAIN, LUMBAR, CHRONIC (ICD-724.2) ROTATOR CUFF REPAIR, LEFT, HX OF (ICD-V45.89) HERNIORRHAPHY, HX OF (ICD-V45.89) Hx of HX, PERSONAL, ALCOHOLISM (ICD-V11.3) RESTLESS LEG SYNDROME (ICD-333.94) OTITIS EXTERNA, ACUTE NEC (ICD-380.22) BRONCHITIS, CHRONIC NOS (ICD-491.9) HIATAL HERNIA (ICD-553.3) TRANSIENT ISCHEMIC ATTACK, HX OF (ICD-V12.50) SEIZURE DISORDER (ICD-780.39) OSTEOPOROSIS (ICD-733.00)    a.  DEXA scan 08/05/2008:  L-spine T score -2.6; Femoral neck T score -3.0    b.  patient unable to take oral bisphosphonates in past due to pain med schedule HYPERTENSION (ICD-401.9) GERD (ICD-530.81) COPD (ICD-496)  Past Surgical History: Last updated: 09/15/2006 March 1998:  T12 Fx January 2003:  Adenosine Cardiolyte May 2004:  Carotid Dopplers NL April 2004:  L5-S1: Lumbar decompression Inguinal herniorrhaphy (02/28/1995);rt Sleep Study 11/05  Family History: Family History of Coronary Artery Disease:  Mom - cancer (bone?) No colon or prostate cancer.  Review of Systems  The patient denies fever, syncope, melena, hematochezia, hematuria, and depression.         see HPI   Physical Exam  General:  alert, well-developed, and well-nourished.   Head:  normocephalic and atraumatic.   Eyes:  pupils equal, pupils round, pupils reactive to light, and no optic disk abnormalities.   Ears:  R ear normal and L ear normal.   Nose:  no external deformity.   Mouth:  pharynx pink and moist.   Neck:  supple, no thyromegaly, no carotid bruits, and no cervical lymphadenopathy.   Lungs:  normal breath sounds.   expiratory rhonchi bilat  Heart:  normal rate and regular rhythm.   Abdomen:  soft, non-tender, and normal bowel sounds.   Rectal:  refuses Genitalia:  refuses  Prostate:  refuses Msk:  neck: no spinal tenderness  left arm: no shoulder deformities no ac joint tenderness no  crepitus empty can test without pain full ROM  Neurologic:  alert & oriented X3 and cranial nerves II-XII intact.   biceps, brachioradialis, triceps DTRs 2+ bilat strength 5/5 and equal bilat  Skin:  turgor normal.   Psych:  in and out of room going back out to tell his ride still in being seen will not stay in room until we finish  Impression & Recommendations:  Problem # 1:  PREVENTIVE HEALTH CARE (ICD-V70.0) discussed PSA and he declines declines HIV testing refuses rest of exam  Orders: T-Hemoccult Cards-Multiple (16109) T-TSH ([id number])  Problem # 2:  HYPERTENSION (ICD-401.9) controlled hopefully he will stay in clinic long enough to get labs  His updated medication list for this problem includes:    Benazepril Hcl 20 Mg Tabs (Benazepril hcl) .[name]... Take one tablet every morning    Furosemide 40 Mg Tabs (Furosemide) .[name]... Take one tablet twice a day  Orders: T-Basic Metabolic Panel [phone]) T-TSH [phone])  Problem # 3:  ARM PAIN (ICD-729.5)  chronic worse over last few mos saw cardiology once and apparently had neg myoview get EKG today get MRI of neck f/u in 2 weeks to go over MRI  Orders: MRI without Contrast (MRI w/o Contrast)  Complete Medication List: 1)  Plavix 75 Mg Tabs (Clopidogrel bisulfate) .... Take one tablet every day 2)  Benazepril Hcl 20 Mg Tabs (Benazepril hcl) .... Take one tablet every morning 3)  Furosemide 40 Mg Tabs (Furosemide) .... Take one tablet twice a day 4)  Requip 4 Mg Tabs (Ropinirole hcl) .... Take one tablet by mouth at bedtime 5)  Dilantin 100 Mg Caps (Phenytoin sodium extended) .... 5 by mouth once daily 6)  Symbicort 160-4.5 Mcg/act Aero (Budesonide-formoterol fumarate) .... Two puffs twice daily 7)  Albuterol Sulfate (2.5 Mg/29ml) 0.083% Nebu (Albuterol sulfate) .... One in neb four times daily as needed 8)  Valium 5 Mg Tabs (Diazepam) .... As needed 9)  Omeprazole 20 Mg Cpdr (Omeprazole) .[name].. 1 by mouth  once daily 10)  Morphine Sulfate 30 Mg Tabs (Morphine sulfate) .... Take 1 tablet by mouth three times a day as needed 11)  Lyrica 50 Mg Caps (Pregabalin) .... Once daily 12)  Boniva 150 Mg Tabs (Ibandronate sodium) .[name].. 1 by mouth once a month 13)  Xopenex Hfa 45 Mcg/act Aero (Levalbuterol tartrate) .... 2 puffs four times daily  Other Orders: Tdap => 42yrs IM [phone]) Admin 1st Vaccine (28413) Admin 1st Vaccine (State) [phone]) T- * Misc. Laboratory test ([phone]) T-Dilantin (Phenytoin) [phone])  Patient Instructions: 1)  Schedule fasting lipids.  Dx V70.0. 2)  Return to see Lorin Picket in 2 weeks to go over MRI.  Laboratory Results   Urine Tests    Routine Urinalysis   Glucose: negative   (Normal Range: Negative) Bilirubin: negative   (Normal Range: Negative) Ketone: negative   (Normal Range: Negative) Spec. Gravity: <1.005   (Normal Range: 1.003-1.035) Blood: trace-intact   (Normal Range: Negative) pH: 7.0   (Normal Range: 5.0-8.0) Protein: negative   (Normal Range: Negative) Urobilinogen: 0.2   (Normal Range: 0-1) Nitrite: negative   (Normal Range: Negative) Leukocyte Esterace: negative   (Normal Range: Negative)        Tetanus/Td Vaccine    Vaccine Type: Tdap    Site: right deltoid    Mfr: Sanofi Pasteur    Dose: 0.5 ml    Route: IM    Given by: [place] Shannon    Exp. Date: 11/06/2011    Lot #: [id number]    VIS given: 11/22/06 version given June 16, 2009.    EKG  Procedure date:  06/16/2009  Findings:      Normal sinus rhythm with rate of:  72 normal axis nonspecific IVCD no ischemic changes no change when compared to previous tracing

## 2010-02-05 NOTE — Progress Notes (Signed)
Summary: albuterol neb vs. duoneb - pt to use duoneb  Phone Note Outgoing Call   Call placed by: Gweneth Dimitri RN,  September 12, 2009 3:57 PM Call placed to: Pharmacy Summary of Call: Received Prior Auth request from [facility] for Albuterol 0.083 neb.  Called, spoke with Eliot Ford to see if this has already be submitted under Medicare Part B.  He stated he will have to call back once this is done.  Will await call back.  Initial call taken by: Gweneth Dimitri RN,  September 12, 2009 3:59 PM  Follow-up for Phone Call        called spoke with Eliot Ford at [facility] to follow up.  he stated that he had spoken with patient who informed him that his albuterol was delivered to his home.  per pt's chart a cmn was signed for generic duoneb, however that medication was never on pt's med list in EMR.  LMOM for pt TCB to clarify.  will hold in my inbox as i left my name on his answering machine. Boone Master CNA/MA  September 17, 2009 4:45 PM   ATC pt, line busy. Boone Master CNA/MA  September 18, 2009 10:58 AM   Additional Follow-up for Phone Call Additional follow up Details #1::        patient returned my call.  verified with patient that his nebs are sent to his home via [facility].  however, patient states that he takes duoneb rather than albuterol as listed on his medication list (pt actually retrieved his medication for me and read to me the name on the package).  Dr. [name], would you like this patient to continue on the duoneb as he is taking or to take albuterol as listed in patient's chart? (duoneb has never been on pt's med list)  please advise, thanks! Additional Follow-up by: Boone Master CNA/MA,  September 18, 2009 5:36 PM    Additional Follow-up for Phone Call Additional follow up Details #2::    use duoneb Follow-up by: Storm Frisk MD,  September 19, 2009 8:50 AM  Additional Follow-up for Phone Call Additional follow up Details #3:: Details for Additional Follow-up Action  Taken: albuterol changed to duoneb on patient's med list. Boone Master CNA/MA  September 19, 2009 8:53 AM   New/Updated Medications: DUONEB 0.5-2.5 (3) MG/3ML SOLN (IPRATROPIUM-ALBUTEROL) inhale 1 vial via neb machine four times a day as needed

## 2010-02-05 NOTE — Letter (Signed)
Summary: [facility]   [facility]   Imported By: Arta Bruce 09/01/2009 11:39:11  _____________________________________________________________________  External Attachment:    Type:   Image     Comment:   External Document

## 2010-02-05 NOTE — Letter (Signed)
Summary: [facility]ING [facility] [facility]  [facility]ING [facility] [facility]   Imported By: Arta Bruce 09/22/2009 15:38:50  _____________________________________________________________________  External Attachment:    Type:   Image     Comment:   External Document

## 2010-02-05 NOTE — Progress Notes (Signed)
Summary: Office Visit//DEPRESSION SCREENING  Office Visit//DEPRESSION SCREENING   Imported By: Arta Bruce 06/24/2009 11:49:13  _____________________________________________________________________  External Attachment:    Type:   Image     Comment:   External Document

## 2010-02-05 NOTE — Progress Notes (Signed)
Summary: Query:  Refill ropinrole and furosemide?  Phone Note Refill Request   Refills Requested: Medication #1:  REQUIP 4 MG  TABS Take one tablet by mouth at bedtime   Last Refilled: 07/23/2009 Initial call taken by: Gaylyn Cheers RN,  January 12, 2010 3:51 PM Caller: [facility]. #[id number]*  Follow-up for Phone Call        Last seen 07/03/09, do you want to refill? Follow-up by: Gaylyn Cheers RN,  January 12, 2010 3:51 PM  Additional Follow-up for Phone Call Additional follow up Details #1::        Also furosemide per protocol?  Dutch Quint RN  January 12, 2010 3:55 PM     Additional Follow-up for Phone Call Additional follow up Details #2::    yes, ok to refill both Follow-up by: Lehman Prom FNP,  January 12, 2010 5:37 PM  Additional Follow-up for Phone Call Additional follow up Details #3:: Details for Additional Follow-up Action Taken: Noted.  Refills completed.  Dutch Quint RN  January 13, 2010 8:34 AM   Prescriptions: REQUIP 4 MG  TABS (ROPINIROLE HCL) Take one tablet by mouth at bedtime  #30 Tablet x 3   Entered by:   Dutch Quint RN   Authorized by:   Lehman Prom FNP   Signed by:   Dutch Quint RN on 01/13/2010   Method used:   Electronically to        [facility]. [phone]* (retail)       [phone]       [place], [place]  [address]       Ph: [phone]       Fax: [phone]   RxID:   [id number]

## 2010-02-05 NOTE — Letter (Signed)
Summary: [facility]   Imported By: Arta Bruce 08/28/2009 11:06:31  _____________________________________________________________________  External Attachment:    Type:   Image     Comment:   External Document

## 2010-02-05 NOTE — Letter (Signed)
Summary: *Referral Letter  [facility]  [address] [place], [place] [address]   Phone: [phone]  Fax: [phone]    07/03/2009  Thank you in advance for agreeing to see my patient:  Ray Green [address] [place], [place]  [address]  Phone: [phone]  Reason for Referral: 60 yo male with significant h/o degenerative disc disease in the past with complaints of increasingly worsening left upper extremity pain.  MRI with significant DDD as noted below.  Please evaluate.  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) 8)  BACK PAIN, LUMBAR, CHRONIC (ICD-724.2) 9)  ROTATOR CUFF REPAIR, LEFT, HX OF (ICD-V45.89) 10)  HERNIORRHAPHY, HX OF (ICD-V45.89) 11)  Hx of HX, PERSONAL, ALCOHOLISM (ICD-V11.3) 12)  RESTLESS LEG SYNDROME (ICD-333.94) 13)  OTITIS EXTERNA, ACUTE NEC (ICD-380.22) 14)  BRONCHITIS, CHRONIC NOS (ICD-491.9) 15)  HIATAL HERNIA (ICD-553.3) 16)  TRANSIENT ISCHEMIC ATTACK, HX OF (ICD-V12.50) 17)  SEIZURE DISORDER (ICD-780.39) 18)  OSTEOPOROSIS (ICD-733.00) 19)  HYPERTENSION (ICD-401.9) 20)  GERD (ICD-530.81) 21)  COPD (ICD-496)  Current Medications: 1)  PLAVIX 75 MG  TABS (CLOPIDOGREL BISULFATE) Take one tablet every day 2)  BENAZEPRIL HCL 20 MG  TABS (BENAZEPRIL HCL) take one tablet every morning 3)  FUROSEMIDE 40 MG  TABS (FUROSEMIDE) take one tablet twice a day 4)  REQUIP 4 MG  TABS (ROPINIROLE HCL) Take one tablet by mouth at bedtime 5)  DILANTIN 100 MG  CAPS (PHENYTOIN SODIUM EXTENDED) 5 by mouth once daily 6)  SYMBICORT 160-4.5 MCG/ACT  AERO (BUDESONIDE-FORMOTEROL FUMARATE) Two puffs twice daily 7)  ALBUTEROL SULFATE (2.5 MG/3ML) 0.083%  NEBU  (ALBUTEROL SULFATE) one in neb four times daily as needed 8)  VALIUM 5 MG  TABS (DIAZEPAM) as needed 9)  OMEPRAZOLE 20 MG  CPDR (OMEPRAZOLE) 1 by mouth once daily 10)  MORPHINE SULFATE 30 MG TABS (MORPHINE SULFATE) Take 1 tablet by mouth three times a day as needed 11)  LYRICA 50 MG CAPS (PREGABALIN) once daily 12)  BONIVA 150 MG TABS (IBANDRONATE SODIUM) 1 by mouth ONCE a month 13)  XOPENEX HFA 45 MCG/ACT AERO (LEVALBUTEROL TARTRATE) 2 puffs four times daily  Past Medical History: 1)  HYPONATREMIA (ICD-276.1) 2)  ANEMIA, SECONDARY TO ACUTE BLOOD LOSS (ICD-285.1) 3)  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  DEXA scan 08/05/2008:  L-spine T score -2.6; Femoral neck T score -3.0 16)     b.  patient unable to take oral bisphosphonates in past due to pain med schedule 17)  HYPERTENSION (ICD-401.9) 18)  GERD (ICD-530.81) 19)  COPD (ICD-496)  Pertinent Labs:  MRI of cervical spine 06/19/2009: IMPRESSION:   1.  Severe left lateral recess and foraminal stenosis at C4-5 and at C6-7 due to uncinate spurs which could affect the left C5 and C7 nerve roots. 2.  Moderate cervical spinal  stenosis at C4-5.   Thank you again for agreeing to see our patient; please contact us if you have any further questions or need additional information.  Sincerely,  Tereso Newcomer PA-C

## 2010-02-05 NOTE — Progress Notes (Signed)
   Phone Note Call from Patient   Summary of Call: tMr. Eckstein needs more refills from ROPINIROLE" medication.  [facility] [phone]) Alben Spittle PA-C Initial call taken by: Manon Hilding,  January 23, 2009 2:52 PM  Follow-up for Phone Call        the pt called back again because he still has not receiving any answer back yet.  Please call him back.Manon Hilding  January 24, 2009 3:05 PM  Additional Follow-up for Phone Call Additional follow up Details #1::        forward to provider, last filled on 03-27-08 #30 x 4    Prescriptions: REQUIP 4 MG  TABS (ROPINIROLE HCL) Take one tablet by mouth at bedtime  #30 Tablet x 5   Entered and Authorized by:   Tereso Newcomer PA-C   Signed by:   Tereso Newcomer PA-C on 01/27/2009   Method used:   Electronically to        [facility]. [phone]* (retail)       [phone]       [place], [place]  [address]       Ph: [phone]       Fax: [phone]   RxID:   [id number]

## 2010-02-05 NOTE — Progress Notes (Signed)
Summary: Patient needs CXR   Phone Note Outgoing Call   Summary of Call: Ray, Patient needs a CXR. Order in system. I want him to do the CXR to work up his arm pain (in addition to the MRI).  Initial call taken by: Brynda Rim,  June 18, 2009 7:29 AM  Follow-up for Phone Call        line is busy... Ray Green  June 18, 2009 12:11 PM  Left message on answer machine for pt. to return call. Gaylyn Cheers RN  June 20, 2009 12:07 PM    Additional Follow-up for Phone Call Additional follow up Details #1::        pt says he will go to [facility]... Additional Follow-up by: Ray Green,  June 20, 2009 1:00 PM

## 2010-02-05 NOTE — Progress Notes (Signed)
Summary: FYI - flu vaccine given at pharmacy  Phone Note Call from Patient   Caller: Patient Call For: WRIGHT Summary of Call: PT GOT FLU SHOT YESTERDAY BUT DID NOT GET PNUEMONIA SHOT Initial call taken by: Rickard Patience,  September 11, 2009 2:12 PM  Follow-up for Phone Call        called spoke with patient who states he had his flu shot yesterday at the [facility] on [address].  this has been added to pt's chart.  pneumonia vax is not needed, was done last year. Follow-up by: Boone Master CNA/MA,  September 11, 2009 2:55 PM      Immunization History:  Influenza Immunization History:    Influenza:  historical (09/10/2009)

## 2010-02-05 NOTE — Assessment & Plan Note (Signed)
Summary: Pulmonary OV   Primary Provider/Referring Provider:  Tereso Newcomer PA-C  CC:  Follow up.  Pt states he is getting SOB "faster."  Denies wheezing, chest tightness, and cough. Currently smoking 1 ppd.  Requesting rx for symbicort - [employer]  History of Present Illness:  Pulmonary OV  This is a 60 year old, white male with chronic obstructive lung disease and asthmatic bronchitic component.     September 02, 2009 3:31 PM No new issues.  Pt is still smoking 1ppd. Still with chronic cough and dyspnea. Pt denies any significant sore throat, nasal congestion or excess secretions, fever, chills, sweats, unintended weight loss, pleurtic or exertional chest pain, orthopnea PND, or leg swelling Pt denies any increase in rescue therapy over baseline, denies waking up needing it or having any early am or nocturnal exacerbations of coughing/wheezing/or dyspnea.   Preventive Screening-Counseling & Management  Alcohol-Tobacco     Smoking Status: current     Packs/Day: 1.0  Current Medications (verified): 1)  Plavix 75 Mg  Tabs (Clopidogrel Bisulfate) .... Take One Tablet Every Day 2)  Benazepril Hcl 20 Mg  Tabs (Benazepril Hcl) .... Take One Tablet Every Morning 3)  Furosemide 40 Mg  Tabs (Furosemide) .... Take 1 Tablet By Mouth Once A Day 4)  Requip 4 Mg  Tabs (Ropinirole Hcl) .... Take One Tablet By Mouth At Bedtime 5)  Dilantin 100 Mg  Caps (Phenytoin Sodium Extended) .... 5 By Mouth Once Daily 6)  Symbicort 160-4.5 Mcg/act  Aero (Budesonide-Formoterol Fumarate) .... Two Puffs Twice Daily 7)  Albuterol Sulfate (2.5 Mg/12ml) 0.083%  Nebu (Albuterol Sulfate) .... One in [place] Four Times Daily As Needed 8)  Valium 5 Mg  Tabs (Diazepam) .... As Needed 9)  Omeprazole 20 Mg  Cpdr (Omeprazole) .[name].. 1 By Mouth Once Daily 10)  Morphine Sulfate 30 Mg Tabs (Morphine Sulfate) .... Take 1 Tablet By Mouth Three Times A Day As Needed 11)  Lyrica 50 Mg Caps (Pregabalin) .... Take 1 Capsule By  Mouth Three Times A Day 12)  Boniva 150 Mg Tabs (Ibandronate Sodium) .[name].. 1 By Mouth Once A Month 13)  Xopenex Hfa 45 Mcg/act Aero (Levalbuterol Tartrate) .... 2 Puffs Four Times Daily  Allergies (verified): 1)  ! Ultram  Past History:  Past medical, surgical, family and social histories (including risk factors) reviewed, and no changes noted (except as noted below).  Past Medical History: Reviewed history from 08/15/2009 and no changes required. 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  DEXA scan 08/05/2008:  L-spine T score -2.6; Femoral neck T score -3.0    b.  patient unable to take oral bisphosphonates in past due to pain med schedule HYPERTENSION (ICD-401.9) GERD (ICD-530.81) COPD (ICD-496) Cervical DDD with radiculopathy   a. MRI done 07/2009   b. eval by Dr. Danielle Dess .. . cervical decompression recommended  Past Surgical History: Reviewed history from 09/15/2006 and no changes required. March 1998:  T12 Fx January 2003:  Adenosine Cardiolyte May 2004:  Carotid Dopplers NL April 2004:  L5-S1: Lumbar decompression Inguinal herniorrhaphy (02/28/1995);rt Sleep Study 11/05  Family History: Reviewed history from 06/16/2009 and no changes required. Family History  of Coronary Artery Disease:  Mom - cancer (bone?) No colon or prostate cancer.  Social History: Reviewed history from 01/31/2007 and no changes required. Patient is a current smoker.  Packs/Day:  1.0  Review of Systems       The patient  complains of shortness of breath with activity, productive cough, and non-productive cough.  The patient denies shortness of breath at rest, coughing up blood, chest pain, irregular heartbeats, acid heartburn, indigestion, loss of appetite, weight change, abdominal pain, difficulty swallowing, sore throat, tooth/dental problems, headaches, nasal congestion/difficulty breathing through nose, sneezing, itching, ear ache, anxiety, depression, hand/feet swelling, joint stiffness or pain, rash, change in color of mucus, and fever.    Vital Signs:  Patient profile:   60 year old male Height:      72 inches Weight:      176.13 pounds BMI:     23.97 O2 Sat:      98 % on Room air Temp:     98.0 degrees F oral Pulse rate:   89 / minute BP sitting:   128 / 84  (left arm) Cuff size:   regular  Vitals Entered By: Gweneth Dimitri RN (September 02, 2009 3:13 PM)  O2 Flow:  Room air CC: Follow up.  Pt states he is getting SOB "faster."  Denies wheezing, chest tightness, cough. Currently smoking 1 ppd.  Requesting rx for symbicort - [facility]. Comments Medications reviewed with patient Daytime contact number verified with patient. Gweneth Dimitri RN  September 02, 2009 3:13 PM    Physical Exam  Additional Exam:  Gen:  well nourished, in no distress ENT: no lesions, mild postnasal drip Neck: No JVD, no TMG, no carotid bruits Lungs: no use of accessory muscles, no dullness to percussion, few expiratory wheezes , prolonged coarse BS Cardiovascular: RRR, heart sounds normal, no murmurs or gallops, no peripheral edema Musculoskeletal: No deformities, no cyanosis or clubbing     Impression & Recommendations:  Problem # 1:  COPD (ICD-496) Assessment Unchanged copd with ongoing tobacco abuse  plan No change in inhaled medications.   Maintain treatment program as currently prescribed.  Medications Added to Medication List This Visit: 1)  Furosemide 40 Mg Tabs (Furosemide) .... Take 1 tablet by  mouth once a day 2)  Symbicort 160-4.5 Mcg/act Aero (Budesonide-formoterol fumarate) .... Two puffs twice daily 3)  Omeprazole 20 Mg Cpdr (Omeprazole) .[name].. 1 by mouth once daily 4)  Lyrica 50 Mg Caps (Pregabalin) .... Take 1 capsule by mouth three times a day  Complete Medication List: 1)  Plavix 75 Mg Tabs (Clopidogrel bisulfate) .... Take one tablet every day 2)  Benazepril Hcl 20 Mg Tabs (Benazepril hcl) .... Take one tablet every morning 3)  Furosemide 40 Mg Tabs (Furosemide) .... Take 1 tablet by mouth once a day 4)  Requip 4 Mg Tabs (Ropinirole hcl) .... Take one tablet by mouth at bedtime 5)  Dilantin 100 Mg Caps (Phenytoin sodium extended) .... 5 by mouth once daily 6)  Symbicort 160-4.5 Mcg/act Aero (Budesonide-formoterol fumarate) .... Two puffs twice daily 7)  Albuterol Sulfate (2.5 Mg/51ml) 0.083% Nebu (Albuterol sulfate) .... One in neb four times daily as needed 8)  Valium 5 Mg Tabs (Diazepam) .... As needed 9)  Omeprazole 20 Mg Cpdr (Omeprazole) .[name].. 1 by mouth once daily 10)  Morphine Sulfate 30 Mg Tabs (Morphine sulfate) .... Take 1 tablet by mouth three times a day as needed 11)  Lyrica 50 Mg Caps (Pregabalin) .... Take 1  capsule by mouth three times a day 12)  Boniva 150 Mg Tabs (Ibandronate sodium) .[name].. 1 by mouth once a month 13)  Xopenex Hfa 45 Mcg/act Aero (Levalbuterol tartrate) .... 2 puffs four times daily  Other Orders: Est. Patient Level III ([address])  Patient Instructions: 1)  No change in medications 2)  Refills sent to pharmacy on Xopenex/symbicort/albuterol 3)  Return 4 months Prescriptions: ALBUTEROL SULFATE (2.5 MG/3ML) 0.083%  NEBU (ALBUTEROL SULFATE) one in neb four times daily as needed  #120 x 6   Entered and Authorized by:   Storm Frisk MD   Signed by:   Storm Frisk MD on 09/02/2009   Method used:   Electronically to        [facility]. [phone]* (retail)       [phone] [address]       [place],  [place]  [address]       Ph: [phone]       Fax: [phone]   RxID:   [id number] SYMBICORT 160-4.5 MCG/ACT  AERO (BUDESONIDE-FORMOTEROL FUMARATE) Two puffs twice daily  #1 x 6   Entered and Authorized by:   Storm Frisk MD   Signed by:   Storm Frisk MD on 09/02/2009   Method used:   Electronically to        [facility]. [phone]* (retail)       [phone] [address]       [place], [place]  [address]       Ph: [phone]       Fax: [phone]   RxID:   [id number] OMEPRAZOLE 20 MG  CPDR (OMEPRAZOLE) 1 by mouth once daily  #30 x 6   Entered and Authorized by:   Storm Frisk MD   Signed by:   Storm Frisk MD on 09/02/2009   Method used:   Electronically to        [facility]. [phone]* (retail)       [phone]       [place], [place]  [address]       Ph: [phone]       Fax: [phone]   RxID:   [id number]

## 2010-02-05 NOTE — Letter (Signed)
Summary: [facility]   Imported By: Arta Bruce 08/15/2009 14:54:18  _____________________________________________________________________  External Attachment:    Type:   Image     Comment:   External Document

## 2010-02-05 NOTE — Miscellaneous (Signed)
Summary: Labs from Dr. Anne Hahn   Clinical Lists Changes  Observations: Added new observation of DILANTIN: 16.9 mcg/mL (08/22/2009 21:39) Added new observation of CALCIUM: 9.2 mg/dL (16/10/9602 54:09) Added new observation of ALBUMIN: 4.3 g/dL (81/19/1478 29:56) Added new observation of PROTEIN, TOT: 6.9 g/dL (21/30/8657 84:69) Added new observation of SGPT (ALT): 12 units/L (08/22/2009 21:39) Added new observation of SGOT (AST): 18 units/L (08/22/2009 21:39) Added new observation of ALK PHOS: 107 units/L (08/22/2009 21:39) Added new observation of BILI TOTAL: 0.2 mg/dL (62/95/2841 32:44) Added new observation of GFR: 106 mL/min (08/22/2009 21:39) Added new observation of CREATININE: 0.65 mg/dL (01/06/7251 66:44) Added new observation of BUN: 7 mg/dL (03/47/4259 56:38) Added new observation of BG RANDOM: 68 mg/dL (75/64/3329 51:88) Added new observation of CO2 PLSM/SER: 19 meq/L (08/22/2009 21:39) Added new observation of CL SERUM: 93 meq/L (08/22/2009 21:39) Added new observation of K SERUM: 4.1 meq/L (08/22/2009 21:39) Added new observation of NA: 131 meq/L (08/22/2009 21:39) Added new observation of % EOS AUTO: 1 % (08/22/2009 21:39) Added new observation of ABSOLUTE MON: 3 K/uL (08/22/2009 21:39) Added new observation of MONOCYTE %: 17 % (08/22/2009 21:39) Added new observation of LYMPHS %: 24 % (08/22/2009 21:39) Added new observation of PMN %: 55 % (08/22/2009 21:39) Added new observation of PLATELETK/UL: 300 K/uL (08/22/2009 21:39) Added new observation of RDW: 13.3 % (08/22/2009 21:39) Added new observation of MCHC RBC: 35.7 g/dL (41/66/0630 16:01) Added new observation of MCV: 91 fL (08/22/2009 21:39) Added new observation of HCT: 38.4 % (08/22/2009 21:39) Added new observation of HGB: 13.7 g/dL (09/32/3557 32:20) Added new observation of RBC M/UL: 4.22 M/uL (08/22/2009 21:39) Added new observation of WBC COUNT: 5.8 10*3/microliter (08/22/2009 21:39)

## 2010-03-30 ENCOUNTER — Other Ambulatory Visit: Payer: Self-pay | Admitting: Critical Care Medicine

## 2010-04-03 ENCOUNTER — Encounter (INDEPENDENT_AMBULATORY_CARE_PROVIDER_SITE_OTHER): Payer: Self-pay | Admitting: Nurse Practitioner

## 2010-04-07 NOTE — Letter (Signed)
[facility]   Imported By: Arta Bruce 04/03/2010 11:40:05  _____________________________________________________________________  External Attachment:    Type:   Image     Comment:   External Document

## 2010-04-21 ENCOUNTER — Other Ambulatory Visit: Payer: Self-pay | Admitting: Critical Care Medicine

## 2010-05-06 ENCOUNTER — Other Ambulatory Visit: Payer: Self-pay | Admitting: Critical Care Medicine

## 2010-05-19 NOTE — Assessment & Plan Note (Signed)
[facility]                             PULMONARY OFFICE NOTE   AIRRION, OTTING                     MRN:          [id number]  DATE:10/24/2006                            DOB:          09-May-1950    Ray Green is a 60 year old male, history of asthmatic bronchitis,  chronic lung disease, still actively smoking, recently had a lumbar  surgery.  He is seen for return followup.  Maintained Serevent one spray  b.i.d., Combivent 4 sprays q.i.d., other maintenance medicines listed in  chart, correct as reviewed.   EXAMINATION:  VITAL SIGNS:  Temp 98, blood pressure 130/80, pulse 89,  saturation 97% room air.  CHEST:  Showed scattered wheezes, fair air flow.  CARDIAC:  Showed a regular rate and rhythm without S3, normal S1, S2.  ABDOMEN:  Soft, nontender.  EXTREMITIES:  Showed no edema, clubbing or venous disease.  SKIN:  Clear.  NEUROLOGIC:  Intact.   IMPRESSION:  Chronic obstructive lung disease with asthmatic bronchitic  component, still actively smoking.   PLAN:  For the patient is to maintain inhaled medicines as prescribed.  A flu vaccine was administered.     Charlcie Cradle [name], MD, [facility]  Electronically Signed    PEW/MedQ  DD: 10/24/2006  DT: 10/25/2006  Job #: [id number]

## 2010-05-19 NOTE — Assessment & Plan Note (Signed)
[facility]                             PULMONARY OFFICE NOTE   ANGAS, Ray Green                     MRN:          [id number]  DATE:09/15/2006                            DOB:          09-Oct-1950    HISTORY OF PRESENT ILLNESS:  The patient is a 60 year old white male  patient of Dr. Lynelle Doctor who is an active smoker with a history of  chronic obstructive pulmonary disease with an asthmatic bronchitic  component. The patient presents today for increased dry cough,  congestion, and wheezing. The patient reports that he is having repeat  back surgery in one month by Dr. Ilean Skill. The patient denies any  purulent sputum, fever, chest pain, orthopnea, PND, or leg swelling. The  patient is maintained on Serevent at 1 puff twice daily along with  Combivent 2 puffs 4 times daily.   PAST MEDICAL HISTORY:  Reviewed.   CURRENT MEDICATIONS:  Reviewed.   PHYSICAL EXAMINATION:  GENERAL:  The patient is a male in no acute  distress.  VITAL SIGNS:  He is afebrile with stable vital signs. O2 saturation is  98% on room air.  HEENT:  Unremarkable.  NECK:  Supple without cervical adenopathy. No JVD.  LUNGS:  Sounds reveal some scattered rhonchi bilaterally with a few  faint expiratory wheezes.  CARDIAC:  Regular rate and rhythm.  ABDOMEN:  Soft and nontender.  EXTREMITIES:  Warm without any edema.   IMPRESSION AND PLAN:  Acute chronic obstructive pulmonary disease  exacerbation. The patient has been given Mucinex DM twice daily.  Prednisone taper over the next week. The patient is once again  encouraged about smoking cessation. The patient will return back with  Dr. Delford Green in 6 to 8 weeks or sooner if needed.      Rubye Oaks, NP  Electronically Signed      Ray Cradle [name], MD, [facility]  Electronically Signed   TP/MedQ  DD: 09/15/2006  DT: 09/16/2006  Job #: [id number]

## 2010-05-19 NOTE — Assessment & Plan Note (Signed)
[facility]                             PULMONARY OFFICE NOTE   ANDERS, HOHMANN                     MRN:          [id number]  DATE:07/13/2006                            DOB:          06/16/1950    Ray Green returns today in followup and is a 60 year old male, active  smoker, with asthmatic bronchitis.  He has had previous CNS cranial  trauma and has difficulty following and maintaining his medication  profile and being compliant due to mental limitations.   He is on:  1. Serevent 1 spray b.i.d.  2. Combivent 4 sprays q.i.d.  3. Albuterol nebulizers p.r.n.   When we have tried to simplify his program it has been very difficult to  change him over to new programs, therefore we have maintained this  current, somewhat unusual medication profile.   EXAMINATION:  Temperature is 97, blood pressure 118/82, pulse 84,  saturation 96% room air.  CHEST:  Showed expiratory wheeze with fair air flow.  CARDIAC:  Showed a regular rate and rhythm without S3, normal S1-S2.  ABDOMEN:  Soft, nontender.  EXTREMITIES:  Showed no edema or clubbing.  SKIN:  Clear.  NEUROLOGIC:  Exam was intact.   IMPRESSION:  That of ongoing airway inflammation as the patient  continues to smoke.  It is difficult to make significant changes in this  patient as he is essentially noncompliant.  We will leave the medication  profile as is, we did renew his Combivent and Serevent, and we did give  him a brief pulse prednisone and taper for his airway inflammation.  We  will see the patient back in return followup in 2 months.     Ray Cradle [name], MD, [facility]  Electronically Signed    PEW/MedQ  DD: 07/14/2006  DT: 07/14/2006  Job #: [id number]

## 2010-05-19 NOTE — Op Note (Signed)
Ray Green, Ray Green              ACCOUNT NO.:  [id number]   MEDICAL RECORD NO.:  [id number]          PATIENT TYPE:  INP   LOCATION:  2899                         FACILITY:  [facility]   PHYSICIAN:  Stefani Dama, M.D.  DATE OF BIRTH:  July 26, 1950   DATE OF PROCEDURE:  10/10/2006  DATE OF DISCHARGE:                               OPERATIVE REPORT   PREOPERATIVE DIAGNOSES:  1. Spondylosis.  2. Herniated nucleus pulposus L4-L5.  3. Status post arthrodesis L5-S1.   POSTOPERATIVE DIAGNOSES:  1. Spondylosis.  2. Herniated nucleus pulposus L4-L5 with lumbar radiculopathy.  3. Status post arthrodesis L5-S1 with pseudoarthrosis L5-S1.   PROCEDURE:  Decompression of L4-L5 via laminectomy and total diskectomy,  posterior lumbar interbody fusion with PEEK spacer, local autograft and  allograft L4-5, revision of pseudoarthrosis L5-S1 with right posterior  lumbar interbody fusion spacer using PEEK local autograft and allograft,  segmental fixation L4 to the sacrum with pedicle screws removal of spire  plate.   SURGEON:  Stefani Dama, M.D.   FIRST ASSISTANT:  Dr. Maeola Harman.   ANESTHESIA:  General endotracheal.   INDICATIONS:  The patient is a 60 year old individual who had a previous  decompression arthrodesis at the L5-S1 level.  He developed increasing  problems with left lower extremity pain and workup demonstrated that he  appeared to have a solid arthrodesis at L5-S1 without any motion at that  segment.  However, he had developed a large disk herniation with severe  spondylosis and stenosis at the L4-L5 level.  With increasing weakness  in the tibialis anterior group on the left side,  he was advised  regarding surgical decompression arthrodesis.   PROCEDURE:  The patient was brought to the operating room supine on the  stretcher.  After the smooth induction of general endotracheal  anesthesia, he was turned prone.  The back was prepped with alcohol and  DuraPrep and draped in  sterile fashion.  A midline incision was created  and carried down to the lumbodorsal fascia which was opened on either  side of midline to expose the spinous processes of L5-S1 and the spire  plate at Z3-Y8.  With some dissection around this area, it was apparent  that there was evidence of nonunion with gross motion of the spinous  process of L5 over the sacrum.  Spire plate was loosened and removed and  this area was further denuded and it was noted that indeed there was  gross evidence of a pseudoarthrosis with movement in the facet joints at  the L5-S1 level.  This was unexpected, however, that having been the  case, it was decided that a decompression at the L5-S1 level would  involve removal of entire spinous process and this was done without  difficulty.  This allowed for examination of the common dural tube and  the L4 was then undercut substantially to expose the L4-L5 disk space.  The facet joints at L4-L5 were then largely sacrificed secondary to the  decompression.  The common dural tube was carefully protected, the  bipolar cautery was used to cauterize the epidural veins in this area.  Then a total diskectomy was performed at the L4-L5 level.  A series of  this disk shapers were then used to shape the disk space and remove all  the cartilaginous endplate that were available on this side.  Similar  procedure was carried out on the opposite side.  Then the interspace was  sized with a series of spacers and it was ultimately felt that the 12 mm  PEEK spacer would be fitted well into this space.  The interspace was  then decorticated of all the endplate material and interbody graft was  placed into the interspace and the interspace was filled with Infuse  also.  A 12 mm PEEK spacer was then placed at L4-L5, L5-S1.  The  pseudoarthrosis demonstrated that there was a significant amount of scar  tissue that had formed on the left side and the right side.  Left sided  L5 nerve root  was decompressed using a 2 and a3 mm Kerrison punch.  On  the right side, the disk space was readily apparent and the space could  easily be entered with a #15 blade and then a series of disk shapers  were used to evacuate a significant quantity of whatever remnants of  disk material could be had in this space.  The interspace was then sized  for an 8 mm spacer and this was placed into the interspace with  autograft and Infuse also.  Once this side was grafted, pedicle entry  sites were then chosen at L4-L5 and the sacrum.  At L4, the 6.5 x 45 mm  screws were placed.  At L5, similar size screws were placed and in the  sacrum 6.5 x 50 mm sacral screws were placed using fluoroscopic  guidance.  Care was taken to make sure that each screw did not have any  evidence of cutout.  The 60 mm precontoured rods were then connected  between the screw heads.  Prior to doing this, the lateral gutters were  decorticated using a combination of Cobb elevators, the cautery and the  high-speed bur.  These areas were then filled with the remnants of the  local autograft along with the Infuse over this space to allow for good  posterior supplemental fusion.  With this, the rods were placed and the  screw heads were locked to them.  Transverse connector was applied  between L4 and L5.  Care was taken to make sure that the nerve roots  particularly on the left side were well decompressed out the foramen.  Once this was ascertained, the wound was closed with lumbodorsal fascia  being closed #1 Vicryl in interrupted fashion over a large Hemovac drain  brought out through a separate stab incision.  The subcutaneous fascia  was closed with 2-0 Vicryl and 3-0 Vicryl used subcuticularly.  Surgical  staples were placed in the skin.  During this procedure, blood loss was  estimated at 1200 mL, 600 mL of Cell Saver blood was returned to the  patient.  The patient tolerated the procedure well and was returned to  the  recovery room in stable condition.      Stefani Dama, M.D.  Electronically Signed     HJE/MEDQ  D:  10/10/2006  T:  10/10/2006  Job:  [id number]

## 2010-05-19 NOTE — Discharge Summary (Signed)
Ray Green, Ray Green              ACCOUNT NO.:  [id number]   MEDICAL RECORD NO.:  [id number]          PATIENT TYPE:  INP   LOCATION:  5156                         FACILITY:  [facility]   PHYSICIAN:  Stefani Dama, M.D.  DATE OF BIRTH:  February 17, 1950   DATE OF ADMISSION:  10/10/2006  DATE OF DISCHARGE:                               DISCHARGE SUMMARY   ADMITTING DIAGNOSIS:  Lumbar spondylosis and herniated nucleus pulposus  L4-L5 with lumbar radiculopathy status post arthrodesis L5-S1.   FINAL DIAGNOSIS:  1. Herniated nucleus pulposus L4-L5 with lumbar spondylosis and lumbar      radiculopathy.  2. Status post arthrodesis L5-S1 with pseudoarthrosis L5-S1.  3. Chronic obstructive pulmonary disease  4. Tobacco abuse.  5. Acute blood loss anemia.  6. Postoperative hyponatremia   CONDITION ON DISCHARGE:  Improving.   HOSPITAL COURSE:  [name] is 60 year old individual who has had  significant back and left lower extremity pain secondary to a large  herniated nucleus pulposus at the level of L4-L5.  He also has had an  arthrodesis at L5-S1.  He was advised regarding need for surgery to fix  L4-L5 and at the time of surgery it was noted that he also had a  pseudoarthrosis at L5-S1, so revision included L4 to the sacrum and  postoperatively the patient has done well.  He did have some acute  postoperative anemia which was observed.  He has also had some  postoperative hyponatremia which was observed.  This resolved  spontaneously.  COPD symptoms improved substantially with the use of his  postoperative inhalers.  He has been advised regarding smoking  cessation, was given a prescription for Percocet #60 without refills,  Valium 5 mg #40 without refills.  He will be seen in about three weeks'  time for further postoperative care.  Home health evaluations with  physical therapy is being arranged in the office or as an outpatient.      Stefani Dama, M.D.  Electronically  Signed     HJE/MEDQ  D:  10/14/2006  T:  10/14/2006  Job:  [id number]

## 2010-05-22 NOTE — Procedures (Signed)
Ray Green, Ray Green              ACCOUNT NO.:  [id number]   MEDICAL RECORD NO.:  [id number]          PATIENT TYPE:  OUT   LOCATION:  SLEEP CENTER                 FACILITY:  [facility]   PHYSICIAN:  Clinton D. Maple Hudson, M.D. DATE OF BIRTH:  1950/06/22   DATE OF STUDY:  11/13/2003                              NOCTURNAL POLYSOMNOGRAM   REFERRING PHYSICIAN:  Turkey R. Rankins, M.D.   INDICATION FOR STUDY AND HISTORY:  Insomnia with sleep apnea.   Epworth sleepiness score 15/24, BMI 28.8, weight 213 pounds.  Note:  The  patient smoked prior to the test, used a Serevent inhaler.  There is a  history of head injury.   SLEEP ARCHITECTURE:  Total sleep time 308 minutes with sleep efficiency of  75%.  Stage I was 10%; stage II, 77%; Stages III and IV were absent.  REM  was 13% of total sleep time.  Latency to sleep onset 44 minutes, latency to  REM 270 minutes.  Awake after sleep onset 61 minutes.  Arousal index 19.9.   RESPIRATORY DATA:  RDI 1.6 per hour which is within normal limits and  reflected a total of 8 hypopneas.  The events were recorded while sleeping  on left side.  REM RDI was 10.4 per hour.  Did not qualify for split study  protocol.   OXYGEN DATA:  Intermittent moderate to loud snoring with desaturation to a  nadir of 83%.  Mean oxygen saturation on room air through the study was 89  to 915.   CARDIAC DATA:  Sinus rhythm with occasional PAC and PVC.   MOVEMENT AND PARASOMNIA:  Bathroom x 3.  A total of 233 limb jerks were  recorded of which 90 were associate with arousal or awakening for a periodic  limb movement with arousal index of 17% per hour which is increased.   IMPRESSION AND RECOMMENDATIONS:  There was no significant sleep disorder  breathing, RDI of 1.6 per hour.  Medication and lifestyle issues including  tobacco use and bronchodilator therapy are likely to have some impact on  sleep quality.  Periodic limb movement with arousal syndrome, 17.5 per hour.  Sleep  quality may be improved by treating this with clonazepam or Requip if  appropriate.   Clinton D. Maple Hudson, M.D.  Diplomate, Biomedical engineer of Sleep Medicine                                                           Clinton D. Maple Hudson, M.D.  Diplomate, [employer] Board   CDY/MEDQ  D:  11/24/2003 09:35:16  T:  11/24/2003 10:08:06  Job:  [id number]   cc:   Benetta Spar R. Rankins, M.D.  [address]  [place]  [place] [address]  Fax: [phone]

## 2010-05-22 NOTE — Op Note (Signed)
NAMECOREN, SAGAN                        ACCOUNT NO.:  [id number]   MEDICAL RECORD NO.:  [id number]                   PATIENT TYPE:  INP   LOCATION:  3012                                 FACILITY:  [facility]   PHYSICIAN:  Stefani Dama, M.D.               DATE OF BIRTH:  [birth year]   DATE OF PROCEDURE:  04/23/2002  DATE OF DISCHARGE:                                 OPERATIVE REPORT   PREOPERATIVE DIAGNOSIS:  L5-S1 herniated nucleus pulposis on left with left  lumbar radiculopathy.   POSTOPERATIVE DIAGNOSIS:  L5-S1 herniated nucleus pulposis on left with left  lumbar radiculopathy.   PROCEDURE:  L5-S1 Metrix microendoscopic discectomy with microdissection  technique and operating microscope   SURGEON:  Barnett Abu, M.D.   FIRST ASSISTANT:  Danae Orleans. Venetia Maxon, M.D.   ANESTHESIA:  General endotracheal.   INDICATIONS:  The patient is a 60 year-old individual who has had  significant back and left lower extremity pain.  The patient has had a  herniated nucleus pulposis on the left side at L5-S1 demonstrated on a  recently performed MRI.  Having failed conservative efforts, he has been  advised regarding surgical decompression of left S1 nerve root.  He is taken  to the operating room.   DESCRIPTION OF PROCEDURE:  The patient was brought to the operating room  supine on the stretcher.  After the smooth induction of general endotracheal  anesthesia, he was turned prone, the back was shaved, prepped with Duraprep,  and draped in a sterile fashion.  Using fluoroscopic localization, the L5-S1  interspace was localized on the left side.  A K-wire was passed through the  laminar arch of L5.  Then using a winding technique, a series of dilators  was placed down the area dissecting the soft tissues.  An 18 mm x 5 cm  endoscopic cannula was ultimately affixed to the operating table with a  clamp.  Microscope was draped and brought into the field.  Through this  aperture then, a  laminotomy was created removing the inferior marginal  lamina of L5 about the mesial walls of the facet.  The dissection was  carried out laterally to expose the mesial facet joint and this was taken  down to expose the underlying common dural tube and the S1 nerve root.  The  nerve root was noted to be bowed dorsally over a fairly rigid mass.  The  mass was incised and was found to be redundant ligament primarily.  However,  by removing this ligament, it was noted to provide some relaxation on the  pass of the nerve root and the common dural tube.  The disc space was then  entered and a significant finding of severely degenerated disc material was  removed from within the confines of this space.  This was done both medially  and laterally.  Further resection of the ligamentous structure that overlaid  the  superior endplate and posterior body of S1 relieved the compression on  the S1 nerve root completely.  Hemostasis from epidural bleeding veins was  obtained with a bipolar cautery.  Some small pledgets of Gelfoam that were  soaked in thrombin which were later irrigated away.  In the end, the S1  nerve root and common dural tube was well decompressed and was passed out  the foramen.  The disc space was evacuated of all the loose disc material  that could be withdrawn using a combination of Kerrison rongeurs.  The  inspection of this area identified that there was  good decompression.  1.0 cc of Fentanyl was left in the epidural space prior  to removing the endoscopic cannula, the microscope and then closing with a  singular fascial stitch of 3-0 Vicryl suture and 3-0 Vicryl on the  subcuticular tissues.  The patient tolerated the procedure well.                                               Stefani Dama, M.D.    Merla Riches  D:  04/23/2002  T:  04/23/2002  Job:  [id number]

## 2010-05-22 NOTE — Assessment & Plan Note (Signed)
[facility]                               PULMONARY OFFICE NOTE   Ray Green, Ray Green                     MRN:          [id number]  DATE:07/16/2005                            DOB:          [birth year]    HISTORY OF PRESENT ILLNESS:  Mr. Ray Green is a 60 year old male with asthmatic  bronchitis, chronic obstructive lung disease, still smoking.  He is coughing  more and wheezing more with the ongoing smoking use.   MEDICATIONS:  1.  He maintains Combivent four sprays q.i.d.  He is over-using the      Combivent inhaler.  2.  Serevent on spray b.i.d.  3.  He has an albuterol nebulizer he takes at bedtime along with the      Combivent.   PHYSICAL EXAMINATION:  GENERAL:  This is a middle-aged male in no distress.  VITAL SIGNS:  Temperature 98, blood pressure 130/82, pulse 83, saturation  96% on room air.  CHEST:  Inspiratory and expiratory wheeze with poor air flow.  CARDIAC:  Regular rate and rhythm without S3.  Normal S1 and S2.  ABDOMEN:  Soft, nontender.  EXTREMITIES:  No edema or clubbing.  SKIN:  Clear.  NEUROLOGIC:  Intact.  HEENT:  No jugular venous distention, no lymphadenopathy.  Oropharynx clear.  NECK:  Supple.   Chest x-ray was obtained today and showed no active disease process, COPD  changes.   IMPRESSION:  Chronic obstructive lung disease with asthmatic bronchitic  flare.   PLAN FOR THIS PATIENT:  Administer Avelox 400 mg a day for a 5-day course  and pulse prednisone at 40 mg at day tapering rapidly.  Will see the patient  back in return followup in 3 weeks.                                   Ray Cradle [name], MD, FCCP   PEW/MedQ  DD:  07/16/2005  DT:  07/16/2005  Job #:  [id number]   cc:   [facility]

## 2010-05-22 NOTE — Discharge Summary (Signed)
Ray Green, Ray Green              ACCOUNT NO.:  [id number]   MEDICAL RECORD NO.:  [id number]          PATIENT TYPE:  INP   LOCATION:  3033                         FACILITY:  [facility]   PHYSICIAN:  Stefani Dama, M.D.  DATE OF BIRTH:  05/30/1950   DATE OF ADMISSION:  09/28/2004  DATE OF DISCHARGE:  10/05/2004                                 DISCHARGE SUMMARY   ADMITTING DIAGNOSIS:  L5-S1 left recurrent herniated nucleus pulposus with  lumbar radiculopathy, lumbar spondylosis.   DISCHARGE AND FINAL DIAGNOSIS:  L5-S1 left recurrent herniated nucleus  pulposus with lumbar radiculopathy, lumbar spondylosis.   OPERATION:  Transforaminal diskectomy L5-S1 with interbody arthrodesis using  PEEK spacer autograft and allograft mixed with OP1 posterolateral  arthrodesis with the same fixation with SPIRE plate.   CONDITION ON DISCHARGE:  Improving.   HOSPITAL COURSE:  Mr. Ray Green is a 60 year old individual who has had  significant history of lumbar radiculopathy involving the left lower  extremities, had a recurrent herniated nucleus pulposus at the L5-S1 space  on the left side. He was advised regarding surgical decompression and  stabilization of the joint as he has had considerable amounts of mechanical  back pain associated with this.  After careful consideration of his options,  he was advised and brought to the hospital to undergo that procedure.  He  tolerated the procedure well. However, because the patient lives alone, he  needs significant help activities of independent daily living and he was, at  one point, seen by the rehabilitation service for consideration of a brief  inpatient rehabilitation stay. However, it was felt that he was improving  rapidly enough that this would not be necessary and he is being sent home  now with home health physical therapy to come visit and evaluate his home  situation.  His incision has remained clean and dry. He has had some slight  drainage from the inferior aspect of the incision, but this has minimized  itself and has now stopped.  He has been advised as to his postoperative  activities and is given a prescription for Percocet #60 without refills,  Valium 5 mg #40 without refills.  He will be seen in the office in three  weeks' time. Home health will evaluate him for physical therapy and  occupational therapy at the house.      Stefani Dama, M.D.  Electronically Signed     HJE/MEDQ  D:  10/05/2004  T:  10/05/2004  Job:  [id number]

## 2010-05-22 NOTE — Assessment & Plan Note (Signed)
[facility]                               PULMONARY OFFICE NOTE   Ray Green, Ray Green                     MRN:          [id number]  DATE:07/30/2005                            DOB:          [birth year]    Ray Green is a 60 year old chronic obstructive lung disease, asthmatic  bronchitis.  Patient still actively smoking.  Patient still saw Dr. Luciana Axe,  thought he may be having black-out seizures.  He is scheduled for a neuro  evaluation.  He is on Dilantin now, 500 mg a day, Lyrica 75 mg b.i.d.  Pulmonary-wise, he maintains albuterol nightly 2.5 mg, Combivent 4 sprays  q.i.d., Serevent 1 spray b.i.d., Plavix 75 mg daily, ibuprofen 800 mg  b.i.d., Neurontin 600 mg t.i.d., metoclopramide 10 mg q.i.d., omeprazole 20  mg daily, benazepril 20 mg daily, furosemide 40 mg daily.   PHYSICAL EXAMINATION:  VITAL SIGNS:  Temp 97.5, blood pressure 110/80, pulse  81, saturation 94% on room air.  GENERAL:  This is a middle-aged male in no distress.  CHEST:  Distant breath sounds.  Prolonged expiratory phase.  No wheeze or  rhonchi noted.  CARDIAC:  Regular rate and rhythm without S3.  Normal S1 and S2.  ABDOMEN:  Soft and nontender.  EXTREMITIES:  No clubbing or edema.  SKIN:  Clear.  NEUROLOGIC:  Intact.   IMPRESSION:  Asthmatic bronchitis with chronic obstructive lung disease,  stable at this time.   PLAN:  Maintain inhaled medicines as currently dosed.  No change in plan of  care is made.  He is encouraged to keep his appointment with neurology.                                   Charlcie Cradle [name], MD, FCCP   PEW/MedQ  DD:  07/30/2005  DT:  07/30/2005  Job #:  [id number]   cc:   [facility]

## 2010-05-22 NOTE — Assessment & Plan Note (Signed)
[facility]                             PULMONARY OFFICE NOTE   BRAXDON, GAPPA                     MRN:          [id number]  DATE:03/30/2006                            DOB:          1950/12/19    Mr. Ray Green is a 60 year old white male, who actively smokes a pack a  day, has frontal lobe issues and has chronic history of medication  noncompliance, on Serevent one spray b.i.d., Combivent four sprays  q.i.d., nebulized albuterol p.r.n.  Other maintenance medicines are  correct as reviewed.  Maintains omeprazole 20 mg daily.   ON EXAM:  Temperature 97, blood pressure 104/78, pulse 84, saturation  97% on room air.  CHEST:  Showed distant breath sounds with prolonged expiratory phase.  A  few expired wheezes were noted.  CARDIAC EXAM:  Showed a regular rate and rhythm without S3, normal S1,  S2.  ABDOMEN:  Soft, nontender.  EXTREMITIES:  Showed no edema, clubbing nor venous disease.  SKIN:  Clear.  NEUROLOGIC EXAM:  Intact.  HEENT EXAM:  Showed no jugular venous distention, no lymphadenopathy.  Oropharynx was clear.  NECK:  Supple.   IMPRESSION:  Chronic lung disease.  Asthmatic bronchitis.  Active  smoking.   PLAN:  To maintain inhaled medicines as currently prescribed.  No change  in plan of care.  We will see this patient back in return followup in  three months.     Charlcie Cradle [name], MD, [facility]  Electronically Signed    PEW/MedQ  DD: 03/30/2006  DT: 03/30/2006  Job #: [id number]   cc:   Tomasa Rand

## 2010-05-22 NOTE — Assessment & Plan Note (Signed)
[facility]                               PULMONARY OFFICE NOTE   KINTE, TRIM                     MRN:          [id number]  DATE:09/28/2005                            DOB:          [birth year]    HISTORY OF PRESENT ILLNESS:  The patient is a 60 year old, white male  patient of Dr. [name] who has a known history of COPD and asthmatic  bronchitis who presents for a 48-month followup.  The patient is maintained  on Combivent four puffs four times a day and Serevent b.i.d. with p.r.n.  albuterol.  The patient reports since last visit he has been doing well  without any increased flare of symptoms.  He had not had a change in his  exercise tolerance as well.  The patient denies any chest pain, orthopnea,  PND or leg swelling.   PAST MEDICAL HISTORY:  Reviewed.   CURRENT MEDICATIONS:  Reviewed.   PHYSICAL EXAMINATION:  GENERAL:  The patient is a pleasant male in no acute  distress.  VITAL SIGNS:  Afebrile with stable vital signs.  O2 saturations 96% on room  air.  HEENT:  Unremarkable.  NECK:  Supple.  LUNG:  Diminished breath sounds at bases.  CARDIAC:  Regular rate and rhythm.  ABDOMEN:  Soft, nontender.  EXTREMITIES:  Warm without any edema.   ASSESSMENT/PLAN:  Chronic obstructive pulmonary disease with an asthmatic  bronchitic component.  The patient is currently doing well.  We will  continue with his current regimen and follow back up with Dr. [name] in 2  months or sooner if needed.      ______________________________  Rubye Oaks, NP    ______________________________  Charlcie Cradle. [name], MD, FCCP     TP/MedQ  DD:  10/01/2005  DT:  10/04/2005  Job #:  [id number]

## 2010-05-22 NOTE — Op Note (Signed)
NAMEBRAYLN, DUQUE              ACCOUNT NO.:  [id number]   MEDICAL RECORD NO.:  [id number]          PATIENT TYPE:  INP   LOCATION:  3033                         FACILITY:  [facility]   PHYSICIAN:  Stefani Dama, M.D.  DATE OF BIRTH:  Sep 10, 1950   DATE OF PROCEDURE:  09/28/2004  DATE OF DISCHARGE:                                 OPERATIVE REPORT   PREOPERATIVE DIAGNOSIS:  Recurrent herniated nucleus pulposus, L5-S1, left,  with left lumbar radiculopathy, spondylosis.   POSTOPERATIVE DIAGNOSIS:  Recurrent herniated nucleus pulposus, L5-S1, left,  with left lumbar radiculopathy, spondylosis.   PROCEDURE:  Left transforaminal diskectomy at L5-S1 with transforaminal  lumbar interbody fusion using PEEK spacer and autograft mixed with allograft  and OP-1, posterolateral arthrodesis with same, fixation with Spire plate.   SURGEON:  Stefani Dama, M.D.   FIRST ASSISTANT:  Danae Orleans. Venetia Maxon, M.D.   ANESTHESIA:  General endotracheal.   INDICATIONS:  Ray Green is a 60 year old individual who 4 years ago  underwent surgical decompression posteriorly.  At that time, he had marked  disk degeneration; he had a diskectomy.  However, he had persistence of the  left lower extremity pain.  He has had marked spondylitic disease with  evidence of a small recurrence of the disk at the L5-S1 level and after  failing considerable efforts at conservative management, I advised surgical  decompression and arthrodesis.   PROCEDURE:  The patient was brought to the operating room supine on the  stretcher.  After smooth induction of general endotracheal anesthesia, he  was turned prone and the back was prepped with DuraPrep and draped in a  sterile fashion.  A midline incision was creating and carried down to  lumbodorsal fascia, which was opened on either side of midline to expose the  spinous process of L5 and S1.  A localizing radiograph identified the L5-S1  interspace positively.  Laminotomy on  the left side was then recreated,  removing scar from the inferior margin of the lamina of L5 out to the mesial  wall of the facet and ultimately, a complete facetectomy was performed in  this region and this bone was harvested and saved for use as autograft.  The  superior articular process of S1 was then undercut from the medial aspect to  expose the common dural tube and the S1 nerve root.  There was noted to be a  thick amount of scar in this region and on the dorsal surface of the dural  tube, a small dural rent had occurred.  This was cleaned and then oversewn  with a singular horizontal mattress suture of 6-0 Prolene, which completely  sealed a leak to a Valsalva 40 cmH20.  With this then, care was taken to  approach the common dural tube and the takeoff the S1 nerve root, which were  gradually each dissected from a significant, very dense scar tissue and they  were mobilized medially.  Underneath this was found a bulge in a posterior  ligament that had reformed after the diskectomy and this was cleared and it  was found to consist of a  small osteophyte plus some disk material.  The  disk space was then entered at this aperture and significant quantity of  severely degenerated disk material was removed from within the disk space.  A disk space spreader was used on the opposite side in the inferior aspect  of the lamina and facet joint at L5-S1.  This was used to open up this facet  joint and lamina interval and disk was then further cleaned from the  entirety of the disk space.  A series of curettes and rongeurs were used to  clean the contralateral aspect of the disk space.  Care was taken to make  sure that all the available cartilage that was attached to the inferior  endplate of L5 and the superior endplate of S1 was removed via this opening  and once this was accomplished, then the disk space was shaped and sized for  a 9-mm interbody graft.  The 9-mm graft was then packed with  OP-1 and the  patient's own bone.  This was tamped into the interspace after filling the  ventral aspect of the void with some the OP-1 mixture, then the dorsal  aspect of the disk space was filled with the OP-1 mixture, after which the  disk space spreader was removed; this allowed for collapse of the disk space  further tamping in the implanted bone material.  With this, hemostasis was  checked around the nerve root and care was taken to make sure that the dural  leak was nicely sealed; when this was verified, then a Spire plate was  attached; this was a standard-size Spire plate and it was attached in the  neutral position with the force clamps being applied to the spinous process  of L5 and S1.  The clamps were then removed.  The posterolateral gutters  were then decorticated and packed with the patient's autologous bone that  was mixed with the allograft and OP-1 and once this was packed, then the  lumbodorsal fascia was reapproximated with #1 Vicryl in interrupted fashion,  2-0 Vicryl was used on the subcutaneous tissues and 3-0 Vicryl  subcuticularly.  Dermabond was placed on the skin.  The patient was  tolerated her procedure well.  Blood loss was estimated 200 mL.      Stefani Dama, M.D.  Electronically Signed     HJE/MEDQ  D:  09/28/2004  T:  09/29/2004  Job:  [id number]

## 2010-05-22 NOTE — Assessment & Plan Note (Signed)
Ray Green                               PULMONARY OFFICE NOTE   HAYS, DUNNIGAN                     MRN:          [id number]  DATE:11/04/2005                            DOB:          [birth year]    Ray Green returns today in followup, still coughing and still having some  shortness of breath, still smoking a pack a day of cigarettes.  Maintains  Combivent four sprays q.i.d., Serevent one spray b.i.d.  Other maintenance  medicines listed in the chart are correct as reviewed.   EXAMINATION:  VITAL SIGNS:  Temperature 97, blood pressure 120/84, pulse 67,  saturation 96% in room air.  CHEST:  Showed distant breath sounds with prolonged expiratory phase.  No  wheeze or rhonchi noted.  CARDIAC:  Showed a regular rate and rhythm without S3, normal S1 and S2.  ABDOMEN:  Soft, nontender.  EXTREMITIES:  Showed no edema or clubbing or venous disease.  SKIN:  Clear.  NEUROLOGIC:  Intact.  HEENT:  Showed no jugular venous distention, no lymphadenopathy, oropharynx  clear, neck supple.   IMPRESSION:  That of chronic obstructive lung disease with asthmatic  bronchitic component.   The plan for this patient is to maintain inhaled medicines as currently  dosed and will see the patient back in return followup.     Charlcie Cradle [name], MD, [facility]  Electronically Signed    PEW/MedQ  DD: 11/04/2005  DT: 11/05/2005  Job #: [id number]

## 2010-05-22 NOTE — Assessment & Plan Note (Signed)
[facility]                             PULMONARY OFFICE NOTE   MAKSYM, PFIFFNER                     MRN:          [id number]  DATE:12/30/2005                            DOB:          Apr 13, 1950    Mr. Ryner returns today in followup, still smoking a pack a day  cigarettes, still having his usual symptoms of cough and dyspnea, no  other change. Maintains;  1. Combivent 4 sprays, q.i.d.  2. Serevent 1 spray b.i.d.  3. Albuterol nebulization h.s. other maintenance are unchanged and      correct as reviewed on exam.  Temperature 97, blood pressure 138/82, pulse 101, saturation 96% room  air.  CHEST: Showed scattered rhonchi without evidence of wheeze or rhonchi.  CARDIAC EXAM: Showed a regular rate rhythm without S3, normal S1, S2.  ABDOMEN: Soft, nontender.  EXTREMITIES: Showed no edema, clubbing, or venous disease.  SKIN: Clear.  NEUROLOGIC EXAM: Intact.  HEENT EXAM: Showed no jugular venous distention, no  lymphadenopathy,oropharynx clear.  NECK: Supple.   IMPRESSION:  Is that of asthmatic bronchitis, chronic obstructive lung  disease, stable at this time.   PLAN:  Maintain inhaled medicines as currently dosed. The patient will  return to this office in 2 months.     Charlcie Cradle [name], MD, [facility]  Electronically Signed    PEW/MedQ  DD: 12/30/2005  DT: 12/30/2005  Job #: [id number]   cc:   [facility]
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
## 2010-07-29 ENCOUNTER — Other Ambulatory Visit: Payer: Self-pay | Admitting: Critical Care Medicine

## 2010-07-29 NOTE — Telephone Encounter (Signed)
Pt was last seen by PW on 09/02/2009.  He has no pending appts.  Yearly follow up scheduled for 08/05/10 with PW at 4:30 -- pt aware.

## 2010-07-31 ENCOUNTER — Encounter: Payer: Self-pay | Admitting: Critical Care Medicine

## 2010-08-05 ENCOUNTER — Ambulatory Visit (INDEPENDENT_AMBULATORY_CARE_PROVIDER_SITE_OTHER): Payer: Medicare Other | Admitting: Critical Care Medicine

## 2010-08-05 ENCOUNTER — Encounter: Payer: Self-pay | Admitting: Critical Care Medicine

## 2010-08-05 VITALS — BP 120/80 | HR 71 | Temp 97.9°F | Ht 72.0 in | Wt 175.0 lb

## 2010-08-05 DIAGNOSIS — J449 Chronic obstructive pulmonary disease, unspecified: Secondary | ICD-10-CM

## 2010-08-05 MED ORDER — IPRATROPIUM-ALBUTEROL 0.5-2.5 (3) MG/3ML IN SOLN: 3.0000 mL | Freq: Four times a day (QID) | RESPIRATORY_TRACT | Status: DC | PRN

## 2010-08-05 MED ORDER — BUDESONIDE-FORMOTEROL FUMARATE 160-4.5 MCG/ACT IN AERO: 2.0000 | INHALATION_SPRAY | Freq: Two times a day (BID) | RESPIRATORY_TRACT | Status: DC

## 2010-08-05 MED ORDER — LEVALBUTEROL TARTRATE 45 MCG/ACT IN AERO: 2.0000 | INHALATION_SPRAY | Freq: Four times a day (QID) | RESPIRATORY_TRACT | Status: DC | PRN

## 2010-08-05 NOTE — Progress Notes (Signed)
Subjective:    Patient ID: Ray Green, male    DOB: 1950/08/03, 60 y.o.   MRN: [id number]  HPI This is a 60 year old, white male with chronic obstructive lung disease and asthmatic bronchitic component.  September 02, 2009 3:31 PM  No new issues. Pt is still smoking 1ppd. Still with chronic cough and dyspnea.  Pt denies any significant sore throat, nasal congestion or excess secretions, fever, chills, sweats, unintended weight loss, pleurtic or exertional chest pain, orthopnea PND, or leg swelling  Pt denies any increase in rescue therapy over baseline, denies waking up needing it or having any early am or nocturnal exacerbations of coughing/wheezing/or dyspnea.   08/05/2010 Now more pain in back.   Pt denies any significant sore throat, nasal congestion or excess secretions, fever, chills, sweats, unintended weight loss, pleurtic or exertional chest pain, orthopnea PND, or leg swelling Pt denies any increase in rescue therapy over baseline, denies waking up needing it or having any early am or nocturnal exacerbations of coughing/wheezing/or dyspnea. Pt also denies any obvious fluctuation in symptoms with  weather or environmental change or other alleviating or aggravating factors  Past Medical History  Diagnosis Date  . Hyponatremia   . Anemia   . Incontinence, feces   . Lumbar back pain   . Alcohol abuse   . HH (hiatus hernia)   . Chronic bronchitis   . TIA (transient ischemic attack)   . Osteoporosis   . Seizure disorder   . Hypertension   . GERD (gastroesophageal reflux disease)   . COPD (chronic obstructive pulmonary disease)   . DDD (degenerative disc disease), cervical   . RLS (restless legs syndrome)      Family History  Problem Relation Age of Onset  . Bone cancer Mother   . Coronary artery disease       History   Social History  . Marital Status: Divorced    Spouse Name: N/A    Number of Children: N/A  . Years of Education: N/A   Occupational History  .  Not on file.   Social History Main Topics  . Smoking status: Current Everyday Smoker -- 1.0 packs/day    Types: Cigarettes  . Smokeless tobacco: Never Used  . Alcohol Use: Not on file  . Drug Use: Not on file  . Sexually Active: Not on file   Other Topics Concern  . Not on file   Social History Narrative  . No narrative on file     Allergies  Allergen Reactions  . Tramadol Hcl      Outpatient Prescriptions Prior to Visit  Medication Sig Dispense Refill  . benazepril (LOTENSIN) 20 MG tablet Take 20 mg by mouth daily.        . clopidogrel (PLAVIX) 75 MG tablet Take 75 mg by mouth daily.        . diazepam (VALIUM) 5 MG tablet Take 5 mg by mouth every 12 (twelve) hours as needed.        . furosemide (LASIX) 40 MG tablet Take 40 mg by mouth daily.       [name] morphine (MSIR) 30 MG tablet Take 30 mg by mouth 3 (three) times daily.       [name] omeprazole (PRILOSEC) 20 MG capsule take 1 capsule by mouth once daily  30 capsule  6  . phenytoin (DILANTIN) 100 MG ER capsule 5 tablets by mouth every pm       . rOPINIRole (REQUIP) 4 MG tablet Take 4 mg  by mouth at bedtime.        [name] ipratropium-albuterol (DUONEB) 0.5-2.5 (3) MG/3ML SOLN Take 3 mLs by nebulization 4 (four) times daily as needed.       . SYMBICORT 160-4.5 MCG/ACT inhaler inhale 2 puffs by mouth twice a day  10.2 g  6  . XOPENEX HFA 45 MCG/ACT inhaler inhale 2 puffs by mouth four times a day  15 g  0  . ibandronate (BONIVA) 150 MG tablet Take 150 mg by mouth every 30 (thirty) days.        . pregabalin (LYRICA) 50 MG capsule Take 50 mg by mouth 3 (three) times daily.             Review of Systems Constitutional:   No  weight loss, night sweats,  Fevers, chills, fatigue, lassitude. HEENT:   No headaches,  Difficulty swallowing,  Tooth/dental problems,  Sore throat,                No sneezing, itching, ear ache, nasal congestion, post nasal drip,   CV:  No chest pain,  Orthopnea, PND, swelling in lower extremities, anasarca,  dizziness, palpitations  GI  No heartburn, indigestion, abdominal pain, nausea, vomiting, diarrhea, change in bowel habits, loss of appetite  Resp: No shortness of breath with exertion or at rest.  No excess mucus, no productive cough,  No non-productive cough,  No coughing up of blood.  No change in color of mucus.  No wheezing.  No chest wall deformity  Skin: no rash or lesions.  GU: no dysuria, change in color of urine, no urgency or frequency.  No flank pain.  MS:  No joint pain or swelling.  No decreased range of motion.  No back pain.  Psych:  No change in mood or affect. No depression or anxiety.  No memory loss.     Objective:   Physical Exam  Filed Vitals:   08/05/10 1639  BP: 120/80  Pulse: 71  Temp: 97.9 F (36.6 C)  TempSrc: Oral  Height: 6' (1.829 m)  Weight: 175 lb (79.379 kg)  SpO2: 98%    Gen: Pleasant, well-nourished, in no distress,  normal affect  ENT: No lesions,  mouth clear,  oropharynx clear, no postnasal drip  Neck: No JVD, no TMG, no carotid bruits  Lungs: No use of accessory muscles, no dullness to percussion, distant bs, exp wheeze   Cardiovascular: RRR, heart sounds normal, no murmur or gallops, no peripheral edema  Abdomen: soft and NT, no HSM,  BS normal  Musculoskeletal: No deformities, no cyanosis or clubbing  Neuro: alert, non focal  Skin: Warm, no lesions or rashes       Assessment & Plan:   COPD Stable copd  Plan No change in medications. Return in         12months    Updated Medication List Outpatient Encounter Prescriptions as of 08/05/2010  Medication Sig Dispense Refill  . benazepril (LOTENSIN) 20 MG tablet Take 20 mg by mouth daily.        . budesonide-formoterol (SYMBICORT) 160-4.5 MCG/ACT inhaler Inhale 2 puffs into the lungs 2 (two) times daily.  10.2 g  6  . clopidogrel (PLAVIX) 75 MG tablet Take 75 mg by mouth daily.        . diazepam (VALIUM) 5 MG tablet Take 5 mg by mouth every 12 (twelve) hours as needed.         . furosemide (LASIX) 40 MG tablet Take 40 mg by mouth  daily.       . ipratropium-albuterol (DUONEB) 0.5-2.5 (3) MG/3ML SOLN Take 3 mLs by nebulization 4 (four) times daily as needed.  360 mL  6  . levalbuterol (XOPENEX HFA) 45 MCG/ACT inhaler Inhale 2 puffs into the lungs every 6 (six) hours as needed for wheezing.  15 g  6  . morphine (MSIR) 30 MG tablet Take 30 mg by mouth 3 (three) times daily.       [name] omeprazole (PRILOSEC) 20 MG capsule take 1 capsule by mouth once daily  30 capsule  6  . phenytoin (DILANTIN) 100 MG ER capsule 5 tablets by mouth every pm       . pregabalin (LYRICA) 75 MG capsule Take 75 mg by mouth every 8 (eight) hours.        [name] rOPINIRole (REQUIP) 4 MG tablet Take 4 mg by mouth at bedtime.        [name] DISCONTD: ipratropium-albuterol (DUONEB) 0.5-2.5 (3) MG/3ML SOLN Take 3 mLs by nebulization 4 (four) times daily as needed.       [name] DISCONTD: SYMBICORT 160-4.5 MCG/ACT inhaler inhale 2 puffs by mouth twice a day  10.2 g  6  . DISCONTDPauline Aus HFA 45 MCG/ACT inhaler inhale 2 puffs by mouth four times a day  15 g  0  . DISCONTD: ibandronate (BONIVA) 150 MG tablet Take 150 mg by mouth every 30 (thirty) days.        [name] DISCONTD: pregabalin (LYRICA) 50 MG capsule Take 50 mg by mouth 3 (three) times daily.

## 2010-08-05 NOTE — Patient Instructions (Signed)
No change in medications. Return in       12 months  

## 2010-08-05 NOTE — Assessment & Plan Note (Signed)
Stable copd  Plan No change in medications. Return in         12months

## 2010-10-15 LAB — COMPREHENSIVE METABOLIC PANEL
ALT: 12
ALT: 13
AST: 18
AST: 21
Albumin: 2.8 — ABNORMAL LOW
Albumin: 3.1 — ABNORMAL LOW
Alkaline Phosphatase: 72
CO2: 26
Calcium: 8.4
Chloride: 102
GFR calc Af Amer: >60
GFR calc Af Amer: >60
Glucose, Bld: 103 — ABNORMAL HIGH
Potassium: 3.5
Sodium: 130 — ABNORMAL LOW
Total Bilirubin: 0.5
Total Protein: 5.2 — ABNORMAL LOW

## 2010-10-15 LAB — CBC
HCT: 25.7 — ABNORMAL LOW
MCHC: 33.8
MCHC: 34.2
MCV: 94.7
MCV: 94
Platelets: 208
Platelets: 225
Platelets: 230
Platelets: 315
RBC: 3.1 — ABNORMAL LOW
RDW: 13.9
WBC: 13.9 — ABNORMAL HIGH
WBC: 9.6
WBC: 6.8

## 2010-10-15 LAB — BASIC METABOLIC PANEL
BUN: 8
CO2: 27
Chloride: 96
Creatinine, Ser: 0.84

## 2010-10-15 LAB — PHENYTOIN LEVEL, TOTAL: Phenytoin Lvl: 16.5

## 2010-10-15 LAB — ABO/RH: ABO/RH(D): O POS

## 2010-11-17 ENCOUNTER — Other Ambulatory Visit: Payer: Self-pay | Admitting: Critical Care Medicine

## 2010-12-10 ENCOUNTER — Telehealth: Payer: Self-pay | Admitting: Critical Care Medicine

## 2010-12-10 NOTE — Telephone Encounter (Signed)
I am assuming that this is just an [place]. LMTCB

## 2010-12-11 NOTE — Telephone Encounter (Signed)
ATC Line still busy 

## 2010-12-11 NOTE — Telephone Encounter (Signed)
ATC x 2 and line was busy, WCB 

## 2010-12-11 NOTE — Telephone Encounter (Signed)
ATC line busy WCB 

## 2010-12-11 NOTE — Telephone Encounter (Signed)
atc line still busy

## 2010-12-14 NOTE — Telephone Encounter (Signed)
Spoke with pt. He states wanted to let us know that he had flu shot in Oct 2012- I have updated his immunizations and nothing further needed per pt.

## 2011-03-18 ENCOUNTER — Other Ambulatory Visit: Payer: Self-pay | Admitting: Critical Care Medicine

## 2011-05-04 ENCOUNTER — Other Ambulatory Visit: Payer: Self-pay | Admitting: Critical Care Medicine

## 2011-06-29 ENCOUNTER — Other Ambulatory Visit: Payer: Self-pay | Admitting: Critical Care Medicine

## 2011-08-25 ENCOUNTER — Telehealth: Payer: Self-pay | Admitting: Critical Care Medicine

## 2011-08-25 ENCOUNTER — Telehealth: Payer: Self-pay

## 2011-08-25 ENCOUNTER — Ambulatory Visit (INDEPENDENT_AMBULATORY_CARE_PROVIDER_SITE_OTHER): Payer: Medicare Other | Admitting: Critical Care Medicine

## 2011-08-25 ENCOUNTER — Encounter: Payer: Self-pay | Admitting: Critical Care Medicine

## 2011-08-25 VITALS — BP 112/62 | HR 80 | Temp 97.7°F | Ht 72.0 in | Wt 176.4 lb

## 2011-08-25 DIAGNOSIS — Z9889 Other specified postprocedural states: Secondary | ICD-10-CM

## 2011-08-25 DIAGNOSIS — J449 Chronic obstructive pulmonary disease, unspecified: Secondary | ICD-10-CM

## 2011-08-25 MED ORDER — ALBUTEROL SULFATE (2.5 MG/3ML) 0.083% IN NEBU: 2.5000 mg | INHALATION_SOLUTION | Freq: Four times a day (QID) | RESPIRATORY_TRACT | Status: DC | PRN

## 2011-08-25 NOTE — Progress Notes (Signed)
Subjective:    Patient ID: Ray Green, male    DOB: [birth year], 61 y.o.   MRN: [id number]  HPI  This is a 61 y.o.  , white male with chronic obstructive lung disease and asthmatic bronchitic component.  September 02, 2009 3:31 PM  No new issues. Pt is still smoking 1ppd. Still with chronic cough and dyspnea.  Pt denies any significant sore throat, nasal congestion or excess secretions, fever, chills, sweats, unintended weight loss, pleurtic or exertional chest pain, orthopnea PND, or leg swelling  Pt denies any increase in rescue therapy over baseline, denies waking up needing it or having any early am or nocturnal exacerbations of coughing/wheezing/or dyspnea.   08/2010 Now more pain in back.   Pt denies any significant sore throat, nasal congestion or excess secretions, fever, chills, sweats, unintended weight loss, pleurtic or exertional chest pain, orthopnea PND, or leg swelling Pt denies any increase in rescue therapy over baseline, denies waking up needing it or having any early am or nocturnal exacerbations of coughing/wheezing/or dyspnea. Pt also denies any obvious fluctuation in symptoms with  weather or environmental change or other alleviating or aggravating factors  08/25/2011 Pt back for annual OV.  Lot of stress with mother .   No real cough, more dyspneic with exertion. No chest pains.  Smokes 1PPD now , was 3PPD.  Pt denies any significant sore throat, nasal congestion or excess secretions, fever, chills, sweats, unintended weight loss, pleurtic or exertional chest pain, orthopnea PND, or leg swelling Pt denies any increase in rescue therapy over baseline, denies waking up needing it or having any early am or nocturnal exacerbations of coughing/wheezing/or dyspnea. Pt also denies any obvious fluctuation in symptoms with  weather or environmental change or other alleviating or aggravating factors   Past Medical History  Diagnosis Date  . Hyponatremia   . Anemia   .  Incontinence, feces   . Lumbar back pain   . Alcohol abuse   . HH (hiatus hernia)   . Chronic bronchitis   . TIA (transient ischemic attack)   . Osteoporosis   . Seizure disorder   . Hypertension   . GERD (gastroesophageal reflux disease)   . COPD (chronic obstructive pulmonary disease)   . DDD (degenerative disc disease), cervical   . RLS (restless legs syndrome)      Family History  Problem Relation Age of Onset  . Bone cancer Mother   . Coronary artery disease       History   Social History  . Marital Status: Divorced    Spouse Name: N/A    Number of Children: N/A  . Years of Education: N/A   Occupational History  . Not on file.   Social History Main Topics  . Smoking status: Current Everyday Smoker -- 1.0 packs/day    Types: Cigarettes  . Smokeless tobacco: Never Used  . Alcohol Use: Not on file  . Drug Use: Not on file  . Sexually Active: Not on file   Other Topics Concern  . Not on file   Social History Narrative  . No narrative on file     Allergies  Allergen Reactions  . Tramadol Hcl      Outpatient Prescriptions Prior to Visit  Medication Sig Dispense Refill  . benazepril (LOTENSIN) 20 MG tablet Take 20 mg by mouth daily.        . clopidogrel (PLAVIX) 75 MG tablet Take 75 mg by mouth daily.        [name]  diazepam (VALIUM) 5 MG tablet Take 5 mg by mouth every 12 (twelve) hours as needed.       . furosemide (LASIX) 40 MG tablet Take 40 mg by mouth daily.       [name] omeprazole (PRILOSEC) 20 MG capsule take 1 capsule by mouth once daily  30 capsule  6  . phenytoin (DILANTIN) 100 MG ER capsule 5 tablets by mouth every pm       . pregabalin (LYRICA) 75 MG capsule Take 75 mg by mouth every 8 (eight) hours.        [name] rOPINIRole (REQUIP) 4 MG tablet Take 4 mg by mouth at bedtime.        . SYMBICORT 160-4.5 MCG/ACT inhaler inhale 2 puffs by mouth twice a day  10.2 g  3  . XOPENEX HFA 45 MCG/ACT inhaler inhale 2 puffs by mouth every 6 hours if needed for wheezing   15 g  2  . ipratropium-albuterol (DUONEB) 0.5-2.5 (3) MG/3ML SOLN Take 3 mLs by nebulization 4 (four) times daily as needed.  360 mL  6  . morphine (MSIR) 30 MG tablet Take 30 mg by mouth 3 (three) times daily.            Review of Systems  Constitutional:   No  weight loss, night sweats,  Fevers, chills, fatigue, lassitude. HEENT:   No headaches,  Difficulty swallowing,  Tooth/dental problems,  Sore throat,                No sneezing, itching, ear ache, nasal congestion, post nasal drip,   CV:  No chest pain,  Orthopnea, PND, swelling in lower extremities, anasarca, dizziness, palpitations  GI  No heartburn, indigestion, abdominal pain, nausea, vomiting, diarrhea, change in bowel habits, loss of appetite  Resp: No shortness of breath with exertion or at rest.  No excess mucus, no productive cough,  No non-productive cough,  No coughing up of blood.  No change in color of mucus.  No wheezing.  No chest wall deformity  Skin: no rash or lesions.  GU: no dysuria, change in color of urine, no urgency or frequency.  No flank pain.  MS:  No joint pain or swelling.  No decreased range of motion.  No back pain.  Psych:  No change in mood or affect. No depression or anxiety.  No memory loss.     Objective:   Physical Exam   Filed Vitals:   08/25/11 1534  BP: 112/62  Pulse: 80  Temp: 97.7 F (36.5 C)  TempSrc: Oral  Height: 6' (1.829 m)  Weight: 176 lb 6.4 oz (80.015 kg)  SpO2: 95%    Gen: Pleasant, well-nourished, in no distress,  normal affect  ENT: No lesions,  mouth clear,  oropharynx clear, no postnasal drip  Neck: No JVD, no TMG, no carotid bruits  Lungs: No use of accessory muscles, no dullness to percussion, distant bs, exp wheeze   Cardiovascular: RRR, heart sounds normal, no murmur or gallops, no peripheral edema  Abdomen: soft and NT, no HSM,  BS normal  Musculoskeletal: No deformities, no cyanosis or clubbing  Neuro: alert, non focal  Skin: Warm, no  lesions or rashes       Assessment & Plan:   COPD 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 benazepril (LOTENSIN) 20 MG tablet Take 20 mg by mouth daily.        . clopidogrel (PLAVIX) 75 MG tablet Take 75 mg by mouth daily.        . diazepam (VALIUM) 5 MG tablet Take 5 mg by mouth every 12 (twelve) hours as needed.       . furosemide (LASIX) 40 MG tablet Take 40 mg by mouth daily.       [name] morphine (MSIR) 15 MG tablet Take 15 mg by mouth 3 (three) times daily. Along with 30 mg tablet for a total of 45 mg three times daily      . omeprazole (PRILOSEC) 20 MG capsule take 1 capsule by mouth once daily  30 capsule  6  . phenytoin (DILANTIN) 100 MG ER capsule 5 tablets by mouth every pm       . pregabalin (LYRICA) 75 MG capsule Take 75 mg by mouth every 8 (eight) hours.        [name] rOPINIRole (REQUIP) 4 MG tablet Take 4 mg by mouth at bedtime.        . SYMBICORT 160-4.5 MCG/ACT inhaler inhale 2 puffs by mouth twice a day  10.2 g  3  . XOPENEX HFA 45 MCG/ACT inhaler inhale 2 puffs by mouth every 6 hours if needed for wheezing  15 g  2  . DISCONTD: morphine (MS CONTIN) 15 MG 12 hr tablet Take 1 tablet by mouth Three times a day.      . albuterol (PROVENTIL) (2.5 MG/3ML) 0.083% nebulizer solution Take 3 mLs (2.5 mg total) by nebulization every 6 (six) hours as needed for wheezing.  150 mL  12  . DISCONTD: albuterol (PROVENTIL) (2.5 MG/3ML) 0.083% nebulizer solution Take 3 mLs (2.5 mg total) by nebulization every 6 (six) hours as needed for wheezing.  150 mL  12  . DISCONTD: ipratropium-albuterol (DUONEB) 0.5-2.5 (3) MG/3ML SOLN Take 3 mLs by nebulization 4 (four) times daily as needed.  360 mL  6  .  DISCONTD: morphine (MSIR) 30 MG tablet Take 30 mg by mouth 3 (three) times daily.

## 2011-08-25 NOTE — Telephone Encounter (Signed)
Error.  Wrong doctor.  Holly D Pryor ° °

## 2011-08-25 NOTE — Telephone Encounter (Signed)
Spoke with [facility] with [employer].  Requesting dx code for albuterol neb rx sent in a few minutes ago.  Advised this code is 496 for COPD.  Felicia ran this through -- it was accepted and albuterol is covered.  Nothing further needed at this time.

## 2011-08-25 NOTE — Patient Instructions (Signed)
Albuterol in nebulizer was sent to the pharmacy No other medication changes Return 12 months

## 2011-08-26 NOTE — Assessment & Plan Note (Signed)
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

## 2011-08-28 ENCOUNTER — Other Ambulatory Visit: Payer: Self-pay | Admitting: Critical Care Medicine

## 2011-09-01 ENCOUNTER — Telehealth: Payer: Self-pay | Admitting: Critical Care Medicine

## 2011-09-01 NOTE — Telephone Encounter (Signed)
Ok to rx ipratropium

## 2011-09-01 NOTE — Telephone Encounter (Signed)
Called and spoke with pt and he stated that the albuterol was sent in for him but the ipratropium was not sent in.  Pt stated that he is needing this med sent to his pharmacy but this is not on his med list.  Pt stated that he has been on this med for a long time. PW please advise. thanks  Allergies  Allergen Reactions  . Tramadol Hcl

## 2011-09-02 MED ORDER — IPRATROPIUM BROMIDE 0.02 % IN SOLN: 500.0000 ug | Freq: Four times a day (QID) | RESPIRATORY_TRACT | Status: DC | PRN

## 2011-09-02 NOTE — Telephone Encounter (Signed)
Pt aware. Ray Green, CMA  

## 2011-09-04 ENCOUNTER — Emergency Department (HOSPITAL_COMMUNITY)
Admission: EM | Admit: 2011-09-04 | Discharge: 2011-09-05 | Disposition: A | Discharge status: Home / Self Care | Payer: Medicare Other | Attending: Emergency Medicine | Admitting: Emergency Medicine

## 2011-09-04 DIAGNOSIS — Z79899 Other long term (current) drug therapy: Secondary | ICD-10-CM | POA: Insufficient documentation

## 2011-09-04 DIAGNOSIS — M5030 Other cervical disc degeneration, unspecified cervical region: Secondary | ICD-10-CM | POA: Insufficient documentation

## 2011-09-04 DIAGNOSIS — S42213A Unspecified displaced fracture of surgical neck of unspecified humerus, initial encounter for closed fracture: Secondary | ICD-10-CM | POA: Insufficient documentation

## 2011-09-04 DIAGNOSIS — J449 Chronic obstructive pulmonary disease, unspecified: Secondary | ICD-10-CM | POA: Insufficient documentation

## 2011-09-04 DIAGNOSIS — Z23 Encounter for immunization: Secondary | ICD-10-CM | POA: Insufficient documentation

## 2011-09-04 DIAGNOSIS — Z720 Tobacco use: Secondary | ICD-10-CM

## 2011-09-04 DIAGNOSIS — W108XXA Fall (on) (from) other stairs and steps, initial encounter: Secondary | ICD-10-CM | POA: Insufficient documentation

## 2011-09-04 DIAGNOSIS — F17200 Nicotine dependence, unspecified, uncomplicated: Secondary | ICD-10-CM | POA: Insufficient documentation

## 2011-09-04 DIAGNOSIS — J4489 Other specified chronic obstructive pulmonary disease: Secondary | ICD-10-CM | POA: Insufficient documentation

## 2011-09-04 DIAGNOSIS — Y92009 Unspecified place in unspecified non-institutional (private) residence as the place of occurrence of the external cause: Secondary | ICD-10-CM | POA: Insufficient documentation

## 2011-09-04 MED ORDER — ONDANSETRON 4 MG PO TBDP
4.0000 mg | ORAL_TABLET | Freq: Once | ORAL | Status: AC
  Administered 2011-09-05: 4 mg via ORAL
  Filled 2011-09-04: qty 1

## 2011-09-04 MED ORDER — HYDROMORPHONE HCL PF 2 MG/ML IJ SOLN
2.0000 mg | Freq: Once | INTRAMUSCULAR | Status: DC
  Filled 2011-09-04: qty 1

## 2011-09-04 MED ORDER — TETANUS-DIPHTH-ACELL PERTUSSIS 5-2.5-18.5 LF-MCG/0.5 IM SUSP
0.5000 mL | Freq: Once | INTRAMUSCULAR | Status: AC
  Administered 2011-09-05: 0.5 mL via INTRAMUSCULAR
  Filled 2011-09-04: qty 0.5

## 2011-09-04 NOTE — ED Notes (Signed)
Per EMS- pt was walking down stairs when he tripped and fell down steps. Pt landed on concrete. Pt hit head and rt shoulder. Pt rt shoulder appears approx 4 inches lower than left shoulder. Pt is unable move rt arm. Pt in KED and sling. Pt received of fentynyl. Pt was diaphoretic and SOB with EMS. Pt was placed on 5L and had relief. Denies LOC or dizziness. Vitals stable

## 2011-09-05 ENCOUNTER — Encounter (HOSPITAL_COMMUNITY): Payer: Self-pay | Admitting: *Deleted

## 2011-09-05 ENCOUNTER — Emergency Department (HOSPITAL_COMMUNITY): Payer: Medicare Other

## 2011-09-05 MED ORDER — OXYCODONE HCL 5 MG PO TABA: 5.0000 mg | ORAL_TABLET | ORAL | Status: DC | PRN

## 2011-09-05 MED ORDER — HYDROMORPHONE HCL PF 2 MG/ML IJ SOLN
2.0000 mg | Freq: Once | INTRAMUSCULAR | Status: AC
  Administered 2011-09-05: 2 mg via INTRAVENOUS

## 2011-09-05 NOTE — ED Notes (Signed)
Patient transported to X-ray 

## 2011-09-05 NOTE — ED Provider Notes (Signed)
History     CSN: [id number]  Arrival date & time 09/04/11  2320   First MD Initiated Contact with Patient 09/04/11 2337      Chief Complaint  Patient presents with  . Fall    (Consider location/radiation/quality/duration/timing/severity/associated sxs/prior treatment) HPI  This patient is a 61 year old man with multiple chronic medical problems who presents from home via EMS with complaints of right shoulder pain. The patient tripped while going down steps and fell down 3 steps and landed on his right shoulder. He denies head trauma and loss of consciousness. He denies pain or trauma to any other region. He describes his pain as 10 over 10, radiates distally it is worse with any movement of the right arm. The patient is right-hand dominant. He denies paresthesias. He is not appreciate motor weakness. He denies chest pain shortness of breath.  Past Medical History  Diagnosis Date  . Hyponatremia   . Anemia   . Incontinence, feces   . Lumbar back pain   . Alcohol abuse   . HH (hiatus hernia)   . Chronic bronchitis   . TIA (transient ischemic attack)   . Osteoporosis   . Seizure disorder   . Hypertension   . GERD (gastroesophageal reflux disease)   . COPD (chronic obstructive pulmonary disease)   . DDD (degenerative disc disease), cervical   . RLS (restless legs syndrome)     Past Surgical History  Procedure Date  . Rotator cuff repair     left  . Lumbar laminectomy/decompression microdiscectomy   . Inguinal hernia repair 02/28/95    rt    Family History  Problem Relation Age of Onset  . Bone cancer Mother   . Coronary artery disease      History  Substance Use Topics  . Smoking status: Current Everyday Smoker -- 1.0 packs/day    Types: Cigarettes  . Smokeless tobacco: Never Used  . Alcohol Use: Not on file      Review of Systems Gen: no weight loss, fevers, chills, night sweats Eyes: no discharge or drainage, no occular pain or visual changes Nose: no  epistaxis or rhinorrhea Mouth: no dental pain, no sore throat Neck: no neck pain Lungs: Chronic shortness of breath with exertion and frequent wheezing which patient attributes to COPD and ongoing 1 pack per day smoking use. CV: no chest pain, palpitations, dependent edema or orthopnea Abd: no abdominal pain, nausea, vomiting GU: no dysuria or gross hematuria MSK: As per history of present illness, otherwise negative Neuro: no headache, no focal neurologic deficits Skin: no rash Psyche: negative.  Allergies  Tramadol hcl  Home Medications   Current Outpatient Rx  Name Route Sig Dispense Refill  . BENAZEPRIL HCL 20 MG PO TABS Oral Take 20 mg by mouth daily.      [name] CLOPIDOGREL BISULFATE 75 MG PO TABS Oral Take 75 mg by mouth daily.      [name] DIAZEPAM 5 MG PO TABS Oral Take 5 mg by mouth every 12 (twelve) hours as needed.     . FUROSEMIDE 40 MG PO TABS Oral Take 40 mg by mouth daily.     . MORPHINE SULFATE 30 MG PO TABS Oral Take 30 mg by mouth 3 (three) times daily.    [name] OMEPRAZOLE 20 MG PO CPDR  take 1 capsule by mouth once daily 30 capsule 6  . PHENYTOIN SODIUM EXTENDED 100 MG PO CAPS  5 tablets by mouth every pm     . PREGABALIN 75  MG PO CAPS Oral Take 75 mg by mouth every 8 (eight) hours.      [name] ROPINIROLE HCL 4 MG PO TABS Oral Take 4 mg by mouth at bedtime.      . SYMBICORT 160-4.5 MCG/ACT IN AERO  inhale 2 puffs by mouth twice a day 10.2 g 11  . XOPENEX HFA 45 MCG/ACT IN AERO  inhale 2 puffs by mouth every 6 hours if needed for wheezing 15 g 2    BP 134/72  Pulse 68  Temp 97.2 F (36.2 C) (Oral)  Resp 20  SpO2 99%  Physical Exam  Gen: appears uncomfortable, agitated, appears chronically ill and much older than stated age. head: NCAT eyes: PERLA, EOMI mouth: no signs of trauma Neck: soft, nontender, no c spine ttp Resp: lungs CTA B, no wheezing. CV: RRR, no murmur, palp pulses in all extremities, skin appears well perfused Back: no steps-offs, no spinal ttp Pelvis:  nontender, stable MSK: deformity of the right shoulder with ttp exquisite pain with any movements of the right arm, radial pulses brisk and symmetric, cap refill 2s at fingers bilaterally, pt can range fingers, refuses to move wrist or elbow because of right shoulder pain.  LUE and bilateral LE are nontender with FROM at all joints.    Skin: no lacs, abrasions, Neuro: no focal deficits     ED Course  Procedures (including critical care time)  Labs Reviewed - No data to display Dg Chest 1 View  09/05/2011  *RADIOLOGY REPORT*  Clinical Data: Fall with right humeral fracture.  Preoperative respiratory exam.  History of COPD.  CHEST - 1 VIEW  Comparison: 06/23/2009  Findings: Lungs show stable advanced COPD.  No evidence of pulmonary edema, infiltrate, pneumothorax or pleural effusion. Heart size and mediastinal contours are stable and within normal limits.  Proximal humeral fracture visible.  IMPRESSION: Stable COPD.   Original Report Authenticated By: Reola Calkins, M.D.    Dg Shoulder Right  09/05/2011  *RADIOLOGY REPORT*  Clinical Data: Fall with right shoulder injury.  RIGHT SHOULDER - 2+ VIEW  Comparison: None.  Findings: There is a comminuted fracture of the proximal humerus consisting of impacted surgical neck fracture and abnormal rotation of the humeral head.  The greater tuberosity is also fractured. There is overlying hematoma in the deltoid region.  The glenoid and AC joint appear intact.  IMPRESSION: Comminuted fracture of the proximal humerus consisting of an impacted surgical neck fracture, abnormal rotation of the humeral head and greater tuberosity fracture.   Original Report Authenticated By: Reola Calkins, M.D.     Patient has an acute fracture of the surgical neck of the humerus with significant impaction and rotational deformity.  He is neurovascularly intact. we are managing pain in the emergency department with Dilaudid. I will place a call to orthopedics.   MDM    Multiple calls to orthopedist on call, Dr. Jerl Santos, have gone unanswered for the past several hours. As the patient is neurovascularly intact and anxious for discharge, we will d/c with plan for sling and pain management. Patient to f/u with Dr. Jerl Santos.        Brandt Loosen, MD 09/05/11 [phone]

## 2011-09-17 ENCOUNTER — Telehealth: Payer: Self-pay | Admitting: Critical Care Medicine

## 2011-09-17 NOTE — Telephone Encounter (Signed)
Spoke with pharmacist and advised that the dx is 496 She verbalized understanding and states nothing further needed.

## 2011-09-28 ENCOUNTER — Other Ambulatory Visit: Payer: Self-pay | Admitting: Critical Care Medicine

## 2011-11-15 ENCOUNTER — Ambulatory Visit: Payer: Medicare Other | Attending: Orthopaedic Surgery | Admitting: Physical Therapy

## 2011-11-15 DIAGNOSIS — IMO0001 Reserved for inherently not codable concepts without codable children: Secondary | ICD-10-CM | POA: Insufficient documentation

## 2011-11-15 DIAGNOSIS — M25519 Pain in unspecified shoulder: Secondary | ICD-10-CM | POA: Insufficient documentation

## 2011-11-15 DIAGNOSIS — M25619 Stiffness of unspecified shoulder, not elsewhere classified: Secondary | ICD-10-CM | POA: Insufficient documentation

## 2011-11-15 DIAGNOSIS — R293 Abnormal posture: Secondary | ICD-10-CM | POA: Insufficient documentation

## 2011-11-22 ENCOUNTER — Ambulatory Visit: Payer: Medicare Other | Admitting: Physical Therapy

## 2011-11-25 ENCOUNTER — Ambulatory Visit: Payer: Medicare Other | Admitting: Rehabilitation

## 2011-11-29 ENCOUNTER — Encounter: Payer: Medicare Other | Admitting: Physical Therapy

## 2011-11-30 ENCOUNTER — Ambulatory Visit: Payer: Medicare Other | Admitting: Physical Therapy

## 2011-12-06 ENCOUNTER — Ambulatory Visit: Payer: Medicare Other | Attending: Orthopaedic Surgery | Admitting: Physical Therapy

## 2011-12-06 DIAGNOSIS — M25619 Stiffness of unspecified shoulder, not elsewhere classified: Secondary | ICD-10-CM | POA: Insufficient documentation

## 2011-12-06 DIAGNOSIS — R293 Abnormal posture: Secondary | ICD-10-CM | POA: Insufficient documentation

## 2011-12-06 DIAGNOSIS — M25519 Pain in unspecified shoulder: Secondary | ICD-10-CM | POA: Insufficient documentation

## 2011-12-06 DIAGNOSIS — IMO0001 Reserved for inherently not codable concepts without codable children: Secondary | ICD-10-CM | POA: Insufficient documentation

## 2011-12-09 ENCOUNTER — Ambulatory Visit: Payer: Medicare Other | Admitting: Rehabilitation

## 2011-12-16 ENCOUNTER — Ambulatory Visit: Payer: Medicare Other | Admitting: Rehabilitation

## 2012-01-11 ENCOUNTER — Encounter (HOSPITAL_COMMUNITY): Payer: Self-pay | Admitting: *Deleted

## 2012-01-11 ENCOUNTER — Emergency Department (HOSPITAL_COMMUNITY)
Admission: EM | Admit: 2012-01-11 | Discharge: 2012-01-11 | Disposition: A | Discharge status: Home / Self Care | Payer: Medicare Other | Attending: Emergency Medicine | Admitting: Emergency Medicine

## 2012-01-11 DIAGNOSIS — Z79899 Other long term (current) drug therapy: Secondary | ICD-10-CM | POA: Insufficient documentation

## 2012-01-11 DIAGNOSIS — J4489 Other specified chronic obstructive pulmonary disease: Secondary | ICD-10-CM | POA: Insufficient documentation

## 2012-01-11 DIAGNOSIS — Z7902 Long term (current) use of antithrombotics/antiplatelets: Secondary | ICD-10-CM | POA: Insufficient documentation

## 2012-01-11 DIAGNOSIS — M5030 Other cervical disc degeneration, unspecified cervical region: Secondary | ICD-10-CM | POA: Insufficient documentation

## 2012-01-11 DIAGNOSIS — I10 Essential (primary) hypertension: Secondary | ICD-10-CM | POA: Insufficient documentation

## 2012-01-11 DIAGNOSIS — Z8719 Personal history of other diseases of the digestive system: Secondary | ICD-10-CM | POA: Insufficient documentation

## 2012-01-11 DIAGNOSIS — F1021 Alcohol dependence, in remission: Secondary | ICD-10-CM | POA: Insufficient documentation

## 2012-01-11 DIAGNOSIS — F17200 Nicotine dependence, unspecified, uncomplicated: Secondary | ICD-10-CM | POA: Insufficient documentation

## 2012-01-11 DIAGNOSIS — Z862 Personal history of diseases of the blood and blood-forming organs and certain disorders involving the immune mechanism: Secondary | ICD-10-CM | POA: Insufficient documentation

## 2012-01-11 DIAGNOSIS — R259 Unspecified abnormal involuntary movements: Secondary | ICD-10-CM | POA: Insufficient documentation

## 2012-01-11 DIAGNOSIS — Z8709 Personal history of other diseases of the respiratory system: Secondary | ICD-10-CM | POA: Insufficient documentation

## 2012-01-11 DIAGNOSIS — R251 Tremor, unspecified: Secondary | ICD-10-CM

## 2012-01-11 DIAGNOSIS — R51 Headache: Secondary | ICD-10-CM | POA: Insufficient documentation

## 2012-01-11 DIAGNOSIS — Z9889 Other specified postprocedural states: Secondary | ICD-10-CM | POA: Insufficient documentation

## 2012-01-11 DIAGNOSIS — J449 Chronic obstructive pulmonary disease, unspecified: Secondary | ICD-10-CM | POA: Insufficient documentation

## 2012-01-11 DIAGNOSIS — G40909 Epilepsy, unspecified, not intractable, without status epilepticus: Secondary | ICD-10-CM | POA: Insufficient documentation

## 2012-01-11 DIAGNOSIS — Z87448 Personal history of other diseases of urinary system: Secondary | ICD-10-CM | POA: Insufficient documentation

## 2012-01-11 DIAGNOSIS — Z8673 Personal history of transient ischemic attack (TIA), and cerebral infarction without residual deficits: Secondary | ICD-10-CM | POA: Insufficient documentation

## 2012-01-11 DIAGNOSIS — K219 Gastro-esophageal reflux disease without esophagitis: Secondary | ICD-10-CM | POA: Insufficient documentation

## 2012-01-11 LAB — COMPREHENSIVE METABOLIC PANEL
ALT: 9 U/L (ref 0–53)
Albumin: 3.7 g/dL (ref 3.5–5.2)
Alkaline Phosphatase: 115 U/L (ref 39–117)
BUN: 7 mg/dL (ref 6–23)
Chloride: 92 mEq/L — ABNORMAL LOW (ref 96–112)
GFR calc Af Amer: >90 mL/min (ref >90)
Glucose, Bld: 94 mg/dL (ref 70–99)
Potassium: 3.5 mEq/L (ref 3.5–5.1)
Sodium: 129 mEq/L — ABNORMAL LOW (ref 135–145)
Total Bilirubin: 0.2 mg/dL — ABNORMAL LOW (ref 0.3–1.2)
Total Protein: 7.5 g/dL (ref 6.0–8.3)

## 2012-01-11 LAB — CBC WITH DIFFERENTIAL/PLATELET
Eosinophils Absolute: 0.1 10*3/uL (ref 0.0–0.7)
Hemoglobin: 15.2 g/dL (ref 13.0–17.0)
Lymphs Abs: 1.4 10*3/uL (ref 0.7–4.0)
MCH: 31.2 pg (ref 26.0–34.0)
Monocytes Relative: 20 % — ABNORMAL HIGH (ref 3–12)
Neutro Abs: 2.4 10*3/uL (ref 1.7–7.7)
Neutrophils Relative %: 49 % (ref 43–77)
Platelets: 287 10*3/uL (ref 150–400)
RBC: 4.87 MIL/uL (ref 4.22–5.81)
WBC: 5 10*3/uL (ref 4.0–10.5)

## 2012-01-11 LAB — URINE MICROSCOPIC-ADD ON

## 2012-01-11 LAB — URINALYSIS, ROUTINE W REFLEX MICROSCOPIC
Bilirubin Urine: NEGATIVE
Glucose, UA: NEGATIVE mg/dL
Ketones, ur: NEGATIVE mg/dL
Protein, ur: NEGATIVE mg/dL
pH: 7 (ref 5.0–8.0)

## 2012-01-11 LAB — TROPONIN I: Troponin I: <0.3 ng/mL (ref <0.30)

## 2012-01-11 NOTE — ED Notes (Signed)
Pt c/o having a spell tonight where he got dizzy and started shaking all over and had to grab to the table and sit down to keep from blacking out; waiting a little while and didn't feel any better so had his daughter bring him to the ER; states when checking in had another spell where he felt like he was going to black out again; pt states has had a lot of anxiety lately with family issues; has not been eating x 2 wks and has lost weight; denies chest pain or shortness of breath; states feels better sitting down

## 2012-01-11 NOTE — ED Provider Notes (Signed)
History     CSN: [id number]  Arrival date & time 01/11/12  0028   First MD Initiated Contact with Patient 01/11/12 [phone]      Chief Complaint  Patient presents with  . Shaking     (Consider location/radiation/quality/duration/timing/severity/associated sxs/prior treatment) HPI This is a 62 year old male with a long-standing 6 history of seizures. He states his been very stressed out for the past 2 weeks. He has had poor sleep over the past 2 weeks. He has not been eating well and has lost weight. Just prior to arrival he had an episode of generalized shaking which was followed by lightheadedness and a feeling that he might pass out. This resolved after several minutes. He did not lose consciousness. He remembers this event. He has a history of seizure auras but states that this was not consistent with prior seizure auras. He states he has been compliant with his Dilantin. On arrival to the ED he had a second episode, this time characterized by a sensation of blood rushing to his head. It too resolved after several minutes. He has not had any further episodes. There is no known trigger, exacerbating or mitigating factor. The symptoms were moderate in severity.  Past Medical History  Diagnosis Date  . Hyponatremia   . Anemia   . Incontinence, feces   . Lumbar back pain   . Alcohol abuse   . HH (hiatus hernia)   . Chronic bronchitis   . TIA (transient ischemic attack)   . Osteoporosis   . Seizure disorder   . Hypertension   . GERD (gastroesophageal reflux disease)   . COPD (chronic obstructive pulmonary disease)   . DDD (degenerative disc disease), cervical   . RLS (restless legs syndrome)     Past Surgical History  Procedure Date  . Rotator cuff repair     left  . Lumbar laminectomy/decompression microdiscectomy   . Inguinal hernia repair 02/28/95    rt    Family History  Problem Relation Age of Onset  . Bone cancer Mother   . Coronary artery disease      History    Substance Use Topics  . Smoking status: Current Every Day Smoker -- 1.0 packs/day    Types: Cigarettes  . Smokeless tobacco: Never Used  . Alcohol Use: No      Review of Systems  All other systems reviewed and are negative.    Allergies  Tramadol hcl  Home Medications   Current Outpatient Rx  Name  Route  Sig  Dispense  Refill  . BENAZEPRIL HCL 20 MG PO TABS   Oral   Take 20 mg by mouth daily.           [name] CLOPIDOGREL BISULFATE 75 MG PO TABS   Oral   Take 75 mg by mouth daily.           [name] DIAZEPAM 5 MG PO TABS   Oral   Take 5 mg by mouth every 12 (twelve) hours as needed.          . FUROSEMIDE 40 MG PO TABS   Oral   Take 40 mg by mouth daily.          . MORPHINE SULFATE 30 MG PO TABS   Oral   Take 30 mg by mouth 3 (three) times daily. Pt stated the morphine was changed to 45mg  he thinks.         . OMEPRAZOLE 20 MG PO CPDR  take 1 capsule by mouth once daily   30 capsule   6   . PHENYTOIN SODIUM EXTENDED 100 MG PO CAPS      5 tablets by mouth every pm          . PREGABALIN 75 MG PO CAPS   Oral   Take 75 mg by mouth every 8 (eight) hours.           . SYMBICORT 160-4.5 MCG/ACT IN AERO      inhale 2 puffs by mouth twice a day   10.2 g   11   . XOPENEX HFA 45 MCG/ACT IN AERO      inhale 2 puffs by mouth every 6 hours if needed for wheezing   1 Inhaler   6   . ROPINIROLE HCL 4 MG PO TABS   Oral   Take 4 mg by mouth at bedtime.             BP 119/63  Pulse 78  Resp 18  SpO2 98%  Physical Exam General: Well-developed, well-nourished male in no acute distress; appearance consistent with age of record HENT: normocephalic, atraumatic Eyes: pupils equal round and reactive to light; extraocular muscles intact Neck: supple Heart: regular rate and rhythm Lungs: Coarse sounds on expiration Abdomen: soft; nondistended; nontender; bowel sounds present Extremities: No deformity; full range of motion; pulses normal Neurologic:  Awake, alert and oriented; motor function intact in all extremities and symmetric; no facial droop; normal coordination speech; negative Romberg; normal finger to nose Skin: Warm and dry Psychiatric: Mildly pressured speech; mildly anxious    ED Course  Procedures (including critical care time)    MDM   Nursing notes and vitals signs, including pulse oximetry, reviewed.  Summary of this visit's results, reviewed by myself:  Labs:  Results for orders placed during the hospital encounter of 01/11/12 (from the past 24 hour(s))  GLUCOSE, CAPILLARY     Status: Abnormal   Collection Time   01/11/12 12:55 AM      Component Value Range   Glucose-Capillary 126 (*) 70 - 99 mg/dL  CBC WITH DIFFERENTIAL     Status: Abnormal   Collection Time   01/11/12  1:03 AM      Component Value Range   WBC 5.0  4.0 - 10.5 K/uL   RBC 4.87  4.22 - 5.81 MIL/uL   Hemoglobin 15.2  13.0 - 17.0 g/dL   HCT 40.9  81.1 - 91.4 %   MCV 87.3  78.0 - 100.0 fL   MCH 31.2  26.0 - 34.0 pg   MCHC 35.8  30.0 - 36.0 g/dL   RDW 78.2  95.6 - 21.3 %   Platelets 287  150 - 400 K/uL   Neutrophils Relative 49  43 - 77 %   Neutro Abs 2.4  1.7 - 7.7 K/uL   Lymphocytes Relative 28  12 - 46 %   Lymphs Abs 1.4  0.7 - 4.0 K/uL   Monocytes Relative 20 (*) 3 - 12 %   Monocytes Absolute 1.0  0.1 - 1.0 K/uL   Eosinophils Relative 3  0 - 5 %   Eosinophils Absolute 0.1  0.0 - 0.7 K/uL   Basophils Relative 1  0 - 1 %   Basophils Absolute 0.0  0.0 - 0.1 K/uL  COMPREHENSIVE METABOLIC PANEL     Status: Abnormal   Collection Time   01/11/12  1:03 AM      Component Value Range   Sodium  129 (*) 135 - 145 mEq/L   Potassium 3.5  3.5 - 5.1 mEq/L   Chloride 92 (*) 96 - 112 mEq/L   CO2 26  19 - 32 mEq/L   Glucose, Bld 94  70 - 99 mg/dL   BUN 7  6 - 23 mg/dL   Creatinine, Ser 1.61  0.50 - 1.35 mg/dL   Calcium 9.4  8.4 - 09.6 mg/dL   Total Protein 7.5  6.0 - 8.3 g/dL   Albumin 3.7  3.5 - 5.2 g/dL   AST 15  0 - 37 U/L   ALT 9  0 - 53 U/L    Alkaline Phosphatase 115  39 - 117 U/L   Total Bilirubin 0.2 (*) 0.3 - 1.2 mg/dL   GFR calc non Af Amer >90  >90 mL/min   GFR calc Af Amer >90  >90 mL/min  URINALYSIS, ROUTINE W REFLEX MICROSCOPIC     Status: Abnormal   Collection Time   01/11/12  1:05 AM      Component Value Range   Color, Urine YELLOW  YELLOW   APPearance CLEAR  CLEAR   Specific Gravity, Urine 1.010  1.005 - 1.030   pH 7.0  5.0 - 8.0   Glucose, UA NEGATIVE  NEGATIVE mg/dL   Hgb urine dipstick SMALL (*) NEGATIVE   Bilirubin Urine NEGATIVE  NEGATIVE   Ketones, ur NEGATIVE  NEGATIVE mg/dL   Protein, ur NEGATIVE  NEGATIVE mg/dL   Urobilinogen, UA 0.2  0.0 - 1.0 mg/dL   Nitrite NEGATIVE  NEGATIVE   Leukocytes, UA NEGATIVE  NEGATIVE  URINE MICROSCOPIC-ADD ON     Status: Normal   Collection Time   01/11/12  1:05 AM      Component Value Range   Squamous Epithelial / LPF RARE  RARE   RBC / HPF 0-2  <3 RBC/hpf   Bacteria, UA RARE  RARE  PHENYTOIN LEVEL, TOTAL     Status: Abnormal   Collection Time   01/11/12  4:25 AM      Component Value Range   Phenytoin Lvl 9.8 (*) 10.0 - 20.0 ug/mL  TROPONIN I     Status: Normal   Collection Time   01/11/12  4:25 AM      Component Value Range   Troponin I <0.30  <0.30 ng/mL    EKG Interpretation:  Date & Time: 01/11/2012 12:47 AM  Rate: 91  Rhythm: normal sinus rhythm  QRS Axis: right  Intervals: normal  ST/T Wave abnormalities: normal  Conduction Disutrbances:none  Narrative Interpretation:   Old EKG Reviewed: unchanged  6:24 AM Patient has been asymptomatic in the ED since his initial triage. There are no significant lab abnormalities to explain his symptoms. The patient has not had other symptoms of illness to suggest this was a shaking chill. This could be related to a seizure disorder but he normally has generalized tonic-clonic seizures with loss of consciousness.            Hanley Seamen, MD 01/11/12 [phone]

## 2012-01-11 NOTE — ED Notes (Signed)
Pt states he has not been eating well the past few days.

## 2012-05-08 ENCOUNTER — Other Ambulatory Visit: Payer: Self-pay | Admitting: Neurology

## 2012-05-22 ENCOUNTER — Ambulatory Visit (HOSPITAL_COMMUNITY)
Admission: RE | Admit: 2012-05-22 | Discharge: 2012-05-22 | Disposition: A | Discharge status: Home / Self Care | Payer: Medicare Other | Source: Ambulatory Visit | Attending: Internal Medicine | Admitting: Internal Medicine

## 2012-05-22 ENCOUNTER — Other Ambulatory Visit (HOSPITAL_COMMUNITY): Payer: Self-pay | Admitting: Internal Medicine

## 2012-05-22 DIAGNOSIS — J40 Bronchitis, not specified as acute or chronic: Secondary | ICD-10-CM

## 2012-05-22 DIAGNOSIS — R05 Cough: Secondary | ICD-10-CM | POA: Insufficient documentation

## 2012-05-22 DIAGNOSIS — F17200 Nicotine dependence, unspecified, uncomplicated: Secondary | ICD-10-CM | POA: Insufficient documentation

## 2012-05-22 DIAGNOSIS — R059 Cough, unspecified: Secondary | ICD-10-CM | POA: Insufficient documentation

## 2012-06-14 ENCOUNTER — Other Ambulatory Visit: Payer: Self-pay | Admitting: Neurology

## 2012-06-20 ENCOUNTER — Other Ambulatory Visit: Payer: Self-pay

## 2012-06-20 MED ORDER — PHENYTOIN SODIUM EXTENDED 100 MG PO CAPS: ORAL_CAPSULE | ORAL | Status: DC

## 2012-08-29 ENCOUNTER — Ambulatory Visit (INDEPENDENT_AMBULATORY_CARE_PROVIDER_SITE_OTHER): Payer: Medicare Other | Admitting: Critical Care Medicine

## 2012-08-29 ENCOUNTER — Encounter: Payer: Self-pay | Admitting: Critical Care Medicine

## 2012-08-29 VITALS — BP 118/72 | HR 70 | Temp 98.2°F | Ht 72.0 in | Wt 169.0 lb

## 2012-08-29 DIAGNOSIS — J449 Chronic obstructive pulmonary disease, unspecified: Secondary | ICD-10-CM

## 2012-08-29 MED ORDER — LEVALBUTEROL TARTRATE 45 MCG/ACT IN AERO: INHALATION_SPRAY | RESPIRATORY_TRACT | Status: DC

## 2012-08-29 MED ORDER — BUDESONIDE-FORMOTEROL FUMARATE 160-4.5 MCG/ACT IN AERO: INHALATION_SPRAY | RESPIRATORY_TRACT | Status: DC

## 2012-08-29 NOTE — Patient Instructions (Addendum)
No change in medications. Return in          1 year 

## 2012-08-29 NOTE — Progress Notes (Signed)
Subjective:    Patient ID: Ray Green, male    DOB: [birth year], 62 y.o.   MRN: [id number]  HPI  This is a 62 y.o.  , white male with chronic obstructive lung disease and asthmatic bronchitic component.  September 02, 2009 3:31 PM  No new issues. Pt is still smoking 1ppd. Still with chronic cough and dyspnea.  Pt denies any significant sore throat, nasal congestion or excess secretions, fever, chills, sweats, unintended weight loss, pleurtic or exertional chest pain, orthopnea PND, or leg swelling  Pt denies any increase in rescue therapy over baseline, denies waking up needing it or having any early am or nocturnal exacerbations of coughing/wheezing/or dyspnea.   08/2010 Now more pain in back.   Pt denies any significant sore throat, nasal congestion or excess secretions, fever, chills, sweats, unintended weight loss, pleurtic or exertional chest pain, orthopnea PND, or leg swelling Pt denies any increase in rescue therapy over baseline, denies waking up needing it or having any early am or nocturnal exacerbations of coughing/wheezing/or dyspnea. Pt also denies any obvious fluctuation in symptoms with  weather or environmental change or other alleviating or aggravating factors  08/25/2011 Pt back for annual OV.  Lot of stress with mother .   No real cough, more dyspneic with exertion. No chest pains.  Smokes 1PPD now , was 3PPD.  Pt denies any significant sore throat, nasal congestion or excess secretions, fever, chills, sweats, unintended weight loss, pleurtic or exertional chest pain, orthopnea PND, or leg swelling Pt denies any increase in rescue therapy over baseline, denies waking up needing it or having any early am or nocturnal exacerbations of coughing/wheezing/or dyspnea. Pt also denies any obvious fluctuation in symptoms with  weather or environmental change or other alleviating or aggravating factors  08/29/2012 Chief Complaint  Patient presents with  . COPD    Breathing is  unchanged. Reports SOB. Denies chest tightenss, coughing or wheezing.   Down to 1ppd smoking.  Cough is less, Pt with dyspnea on exertion.   Pt denies any significant sore throat, nasal congestion or excess secretions, fever, chills, sweats, unintended weight loss, pleurtic or exertional chest pain, orthopnea PND, or leg swelling Pt denies any increase in rescue therapy over baseline, denies waking up needing it or having any early am or nocturnal exacerbations of coughing/wheezing/or dyspnea. Pt also denies any obvious fluctuation in symptoms with  weather or environmental change or other alleviating or aggravating factors   Past Medical History  Diagnosis Date  . Hyponatremia   . Anemia   . Incontinence, feces   . Lumbar back pain   . Alcohol abuse   . HH (hiatus hernia)   . Chronic bronchitis   . TIA (transient ischemic attack)   . Osteoporosis   . Seizure disorder   . Hypertension   . GERD (gastroesophageal reflux disease)   . COPD (chronic obstructive pulmonary disease)   . DDD (degenerative disc disease), cervical   . RLS (restless legs syndrome)      Family History  Problem Relation Age of Onset  . Bone cancer Mother   . Coronary artery disease       History   Social History  . Marital Status: Divorced    Spouse Name: N/A    Number of Children: N/A  . Years of Education: N/A   Occupational History  . Not on file.   Social History Main Topics  . Smoking status: Current Every Day Smoker -- 1.00 packs/day  Types: Cigarettes  . Smokeless tobacco: Never Used  . Alcohol Use: No  . Drug Use: Not on file  . Sexual Activity: Not on file   Other Topics Concern  . Not on file   Social History Narrative  . No narrative on file     Allergies  Allergen Reactions  . Tramadol Hcl Other (See Comments)    unknown     Outpatient Prescriptions Prior to Visit  Medication Sig Dispense Refill  . benazepril (LOTENSIN) 20 MG tablet Take 20 mg by mouth daily.         . clopidogrel (PLAVIX) 75 MG tablet Take 75 mg by mouth daily.        . diazepam (VALIUM) 5 MG tablet Take 5 mg by mouth every 12 (twelve) hours as needed.       . furosemide (LASIX) 40 MG tablet Take 40 mg by mouth daily.       [name] morphine (MSIR) 30 MG tablet Take 30 mg by mouth 3 (three) times daily. Pt stated the morphine was changed to 45mg  he thinks.      [name] omeprazole (PRILOSEC) 20 MG capsule take 1 capsule by mouth once daily  30 capsule  6  . phenytoin (DILANTIN) 100 MG ER capsule Take 5 capsules po daily as directed  150 capsule  3  . pregabalin (LYRICA) 75 MG capsule Take 75 mg by mouth every 8 (eight) hours.        [name] rOPINIRole (REQUIP) 4 MG tablet Take 4 mg by mouth at bedtime.        . SYMBICORT 160-4.5 MCG/ACT inhaler inhale 2 puffs by mouth twice a day  10.2 g  11  . XOPENEX HFA 45 MCG/ACT inhaler inhale 2 puffs by mouth every 6 hours if needed for wheezing  1 Inhaler  6   No facility-administered medications prior to visit.       Review of Systems  Constitutional:   No  weight loss, night sweats,  Fevers, chills, fatigue, lassitude. HEENT:   No headaches,  Difficulty swallowing,  Tooth/dental problems,  Sore throat,                No sneezing, itching, ear ache, nasal congestion, post nasal drip,   CV:  No chest pain,  Orthopnea, PND, swelling in lower extremities, anasarca, dizziness, palpitations  GI  No heartburn, indigestion, abdominal pain, nausea, vomiting, diarrhea, change in bowel habits, loss of appetite  Resp: No shortness of breath with exertion or at rest.  No excess mucus, no productive cough,  No non-productive cough,  No coughing up of blood.  No change in color of mucus.  No wheezing.  No chest wall deformity  Skin: no rash or lesions.  GU: no dysuria, change in color of urine, no urgency or frequency.  No flank pain.  MS:  No joint pain or swelling.  No decreased range of motion.  No back pain.  Psych:  No change in mood or affect. No depression or  anxiety.  No memory loss.     Objective:   Physical Exam   Filed Vitals:   08/29/12 1617  BP: 118/72  Pulse: 70  Temp: 98.2 F (36.8 C)  TempSrc: Oral  Height: 6' (1.829 m)  Weight: 169 lb (76.658 kg)  SpO2: 96%    Gen: Pleasant, well-nourished, in no distress,  normal affect  ENT: No lesions,  mouth clear,  oropharynx clear, no postnasal drip  Neck: No JVD, no TMG,  no carotid bruits  Lungs: No use of accessory muscles, no dullness to percussion, distant bs, exp wheeze   Cardiovascular: RRR, heart sounds normal, no murmur or gallops, no peripheral edema  Abdomen: soft and NT, no HSM,  BS normal  Musculoskeletal: No deformities, no cyanosis or clubbing  Neuro: alert, non focal  Skin: Warm, no lesions or rashes       Assessment & Plan:   No problem-specific assessment & plan notes found for this encounter.   Updated Medication List Outpatient Encounter Prescriptions as of 08/29/2012  Medication Sig Dispense Refill  . benazepril (LOTENSIN) 20 MG tablet Take 20 mg by mouth daily.        . clopidogrel (PLAVIX) 75 MG tablet Take 75 mg by mouth daily.        . diazepam (VALIUM) 5 MG tablet Take 5 mg by mouth every 12 (twelve) hours as needed.       . furosemide (LASIX) 40 MG tablet Take 40 mg by mouth daily.       [name] morphine (MSIR) 15 MG tablet Take 15 mg by mouth every 4 (four) hours as needed for pain.      [name] morphine (MSIR) 30 MG tablet Take 30 mg by mouth 3 (three) times daily. Pt stated the morphine was changed to 45mg  he thinks.      [name] omeprazole (PRILOSEC) 20 MG capsule take 1 capsule by mouth once daily  30 capsule  6  . phenytoin (DILANTIN) 100 MG ER capsule Take 5 capsules po daily as directed  150 capsule  3  . pregabalin (LYRICA) 75 MG capsule Take 75 mg by mouth every 8 (eight) hours.        [name] rOPINIRole (REQUIP) 4 MG tablet Take 4 mg by mouth at bedtime.        . SYMBICORT 160-4.5 MCG/ACT inhaler inhale 2 puffs by mouth twice a day  10.2 g  11  .  XOPENEX HFA 45 MCG/ACT inhaler inhale 2 puffs by mouth every 6 hours if needed for wheezing  1 Inhaler  6   No facility-administered encounter medications on file as of 08/29/2012.

## 2012-08-30 NOTE — Assessment & Plan Note (Signed)
Chronic obstructive lung disease with asthmatic bronchitic component Ongoing tobacco use Difficult patient and that this patient's had prior head injury and is very impulsive and somewhat challenging to work with Plan Maintain Symbicort Return 1 year

## 2012-09-20 ENCOUNTER — Ambulatory Visit: Payer: Self-pay | Admitting: Nurse Practitioner

## 2012-10-03 ENCOUNTER — Ambulatory Visit (INDEPENDENT_AMBULATORY_CARE_PROVIDER_SITE_OTHER): Payer: Medicare Other | Admitting: Nurse Practitioner

## 2012-10-03 ENCOUNTER — Encounter: Payer: Self-pay | Admitting: Nurse Practitioner

## 2012-10-03 VITALS — BP 133/69 | HR 74 | Ht 69.0 in | Wt 168.0 lb

## 2012-10-03 DIAGNOSIS — G2581 Restless legs syndrome: Secondary | ICD-10-CM

## 2012-10-03 DIAGNOSIS — R569 Unspecified convulsions: Secondary | ICD-10-CM

## 2012-10-03 DIAGNOSIS — Z79899 Other long term (current) drug therapy: Secondary | ICD-10-CM

## 2012-10-03 MED ORDER — PHENYTOIN SODIUM EXTENDED 100 MG PO CAPS: ORAL_CAPSULE | ORAL | Status: DC

## 2012-10-03 NOTE — Patient Instructions (Addendum)
Will check labs Renew meds F/U yearly

## 2012-10-03 NOTE — Progress Notes (Signed)
[facility]  PATIENT: Ray Green DOB: 1950-08-23   REASON FOR VISIT: Followup for seizure disorder   HISTORY OF PRESENT ILLNESS: Ray Green, 62 year old  right-handed white male with a history of seizures returns for followup. The patient has done well since last seen without recurrence of seizures. The patient has not had a seizure in over 7.5  years. The patient is on Dilantin, and is tolerating the medication very well. The patient does have a history of osteoporosis, but he has stopped taking Fosamax, and he is no longer on calcium and vitamin D supplementation because they caused constipation.  The patient has not had any recent falls. The patient does report some back pain and recently had an injection in to the area according to his care giver.    REVIEW OF SYSTEMS: Full 14 system review of systems performed and notable only for:  Constitutional: N/A  Cardiovascular: N/A  Ear/Nose/Throat: N/A  Skin: N/A  Eyes: N/A  Respiratory: COPD, shortness of breath  Gastroitestinal: N/A  Hematology/Lymphatic: N/A  Endocrine: N/A Musculoskeletal:N/A  Allergy/Immunology: N/A  Neurological: N/A Psychiatric: N/A   ALLERGIES: Allergies  Allergen Reactions  . Tramadol Hcl Other (See Comments)    unknown    HOME MEDICATIONS: Outpatient Prescriptions Prior to Visit  Medication Sig Dispense Refill  . benazepril (LOTENSIN) 20 MG tablet Take 20 mg by mouth daily.        . budesonide-formoterol (SYMBICORT) 160-4.5 MCG/ACT inhaler inhale 2 puffs by mouth twice a day  10.2 g  11  . clopidogrel (PLAVIX) 75 MG tablet Take 75 mg by mouth daily.        . diazepam (VALIUM) 5 MG tablet Take 5 mg by mouth every 12 (twelve) hours as needed.       . furosemide (LASIX) 40 MG tablet Take 40 mg by mouth daily.       [name] levalbuterol (XOPENEX HFA) 45 MCG/ACT inhaler inhale 2 puffs by mouth every 6 hours if needed for wheezing  1 Inhaler  6  . morphine (MSIR) 15 MG tablet Take 15  mg by mouth every 4 (four) hours as needed for pain.      [name] morphine (MSIR) 30 MG tablet Take 30 mg by mouth 3 (three) times daily. Pt stated the morphine was changed to 45mg  he thinks.      [name] omeprazole (PRILOSEC) 20 MG capsule take 1 capsule by mouth once daily  30 capsule  6  . phenytoin (DILANTIN) 100 MG ER capsule Take 5 capsules po daily as directed  150 capsule  3  . pregabalin (LYRICA) 75 MG capsule Take 75 mg by mouth every 8 (eight) hours.        [name] rOPINIRole (REQUIP) 4 MG tablet Take 4 mg by mouth at bedtime.         No facility-administered medications prior to visit.    PAST MEDICAL HISTORY: Past Medical History  Diagnosis Date  . Hyponatremia   . Anemia   . Incontinence, feces   . Lumbar back pain   . Alcohol abuse   . HH (hiatus hernia)   . Chronic bronchitis   . TIA (transient ischemic attack)   . Osteoporosis   . Seizure disorder   . Hypertension   . GERD (gastroesophageal reflux disease)   . COPD (chronic obstructive pulmonary disease)   . DDD (degenerative disc disease), cervical   . RLS (restless legs syndrome)   . Seizures     PAST  SURGICAL HISTORY: Past Surgical History  Procedure Laterality Date  . Rotator cuff repair      left  . Lumbar laminectomy/decompression microdiscectomy    . Inguinal hernia repair  02/28/95    rt    FAMILY HISTORY: Family History  Problem Relation Age of Onset  . Bone cancer Mother   . Coronary artery disease      SOCIAL HISTORY: History   Social History  . Marital Status: Divorced    Spouse Name: N/A    Number of Children: N/A  . Years of Education: N/A   Occupational History  . Not on file.   Social History Main Topics  . Smoking status: Current Every Day Smoker -- 1.00 packs/day    Types: Cigarettes  . Smokeless tobacco: Never Used  . Alcohol Use: No     Comment: pt quit 5 years ago.   . Drug Use: No  . Sexual Activity: Not on file   Other Topics Concern  . Not on file   Social History Narrative    Patient is divorced and has 1 child.    Patient is single.    Patient has a high school education.      PHYSICAL EXAM  Filed Vitals:   10/03/12 1530  BP: 133/69  Pulse: 74  Height: 5\' 9"  (1.753 m)  Weight: 168 lb (76.204 kg)   Body mass index is 24.8 kg/(m^2).  Generalized: Well developed, in no acute distress  Musculoskeletal: No deformity   Neurological examination   Mentation: Alert oriented to time, place, history taking. Follows all commands speech and language fluent  Cranial nerve II-XII: Pupils were equal round reactive to light extraocular movements were full, visual field were full on confrontational test. Facial sensation and strength were normal. hearing was intact to finger rubbing bilaterally. Uvula tongue midline. head turning and shoulder shrug and were normal and symmetric.Tongue protrusion into cheek strength was normal. Motor: normal bulk and tone, full strength in the BUE, BLE, fine finger movements normal, no pronator drift. No focal weakness Coordination: finger-nose-finger, heel-to-shin bilaterally, no dysmetria Reflexes: 1+ upper and lower and symmetric Gait and Station: Rising up from seated position without assistance, normal stance,  moderate stride, good arm swing, smooth turning, able to perform tiptoe, and heel walking without difficulty. Tandem gait is unsteady  DIAGNOSTIC DATA (LABS, IMAGING, TESTING) - I reviewed patient records, labs, notes, testing and imaging myself where available.  Lab Results  Component Value Date   WBC 5.0 01/11/2012   HGB 15.2 01/11/2012   HCT 42.5 01/11/2012   MCV 87.3 01/11/2012   PLT 287 01/11/2012      Component Value Date/Time   NA 129* 01/11/2012 0103   K 3.5 01/11/2012 0103   CL 92* 01/11/2012 0103   CO2 26 01/11/2012 0103   GLUCOSE 94 01/11/2012 0103   BUN 7 01/11/2012 0103   CREATININE 0.68 01/11/2012 0103   CALCIUM 9.4 01/11/2012 0103   CALCIUM 9.0 01/22/2009 0000   PROT 7.5 01/11/2012 0103   ALBUMIN 3.7 01/11/2012 0103    AST 15 01/11/2012 0103   ALT 9 01/11/2012 0103   ALKPHOS 115 01/11/2012 0103   BILITOT 0.2* 01/11/2012 0103   GFRNONAA >90 01/11/2012 0103   GFRAA >90 01/11/2012 0103    ASSESSMENT AND PLAN  62 y.o. year old male  has a past medical history of  Seizure disorder; here for followup. No seizure and 7-1/2 years.  Will check labs, CBC, CMP and Dilantin level Renew meds  F/U yearly  Nilda Riggs, [facility], [facility], APRN  [facility] [address], [address] [place], [place] [address] [phone]

## 2012-10-03 NOTE — Progress Notes (Signed)
I have read the note, and I agree with the clinical assessment and plan.  Kayman Snuffer KEITH   

## 2012-10-04 LAB — CBC WITH DIFFERENTIAL/PLATELET
Basophils Absolute: 0 10*3/uL (ref 0.0–0.2)
Basos: 0 %
Eosinophils Absolute: 0.2 10*3/uL (ref 0.0–0.4)
HCT: 37 % — ABNORMAL LOW (ref 37.5–51.0)
Lymphocytes Absolute: 1.9 10*3/uL (ref 0.7–3.1)
MCH: 32.2 pg (ref 26.6–33.0)
MCHC: 35.7 g/dL (ref 31.5–35.7)
MCV: 90 fL (ref 79–97)
Monocytes Absolute: 1.3 10*3/uL — ABNORMAL HIGH (ref 0.1–0.9)
Neutrophils Absolute: 4.4 10*3/uL (ref 1.4–7.0)
RBC: 4.1 x10E6/uL — ABNORMAL LOW (ref 4.14–5.80)
WBC: 7.7 10*3/uL (ref 3.4–10.8)

## 2012-10-04 LAB — COMPREHENSIVE METABOLIC PANEL
ALT: 11 IU/L (ref 0–44)
AST: 17 IU/L (ref 0–40)
Albumin/Globulin Ratio: 1.6 (ref 1.1–2.5)
Creatinine, Ser: 0.64 mg/dL — ABNORMAL LOW (ref 0.76–1.27)
GFR calc Af Amer: 120 mL/min/{1.73_m2} (ref >59)
GFR calc non Af Amer: 104 mL/min/{1.73_m2} (ref >59)
Globulin, Total: 2.6 g/dL (ref 1.5–4.5)
Potassium: 3.9 mmol/L (ref 3.5–5.2)
Sodium: 132 mmol/L — ABNORMAL LOW (ref 134–144)
Total Bilirubin: 0.2 mg/dL (ref 0.0–1.2)
Total Protein: 6.7 g/dL (ref 6.0–8.5)

## 2012-10-04 NOTE — Progress Notes (Signed)
Quick Note:  I called and LMVM for pt that labs were stable. Pt to call back if questions. ______

## 2013-02-02 ENCOUNTER — Telehealth: Payer: Self-pay | Admitting: Critical Care Medicine

## 2013-02-02 NOTE — Telephone Encounter (Signed)
States insurance will no longer pay for symbicort.  Will discuss with Dr. Delford FieldWright on what inhaler is equivalent to symbicort and switch him. Pt to come pick up sample of symbicort 160 per PW until decision is made

## 2013-02-05 ENCOUNTER — Telehealth: Payer: Self-pay | Admitting: Critical Care Medicine

## 2013-02-05 NOTE — Telephone Encounter (Signed)
Pt misunderstood pharmacy and thought his symbicort was no longer covered. This was incorrect information, pharmacy had said it was to early to pick up rx. Samples were left at the front desk for pt to pick up.  Pt now able to get rx from [facility] as well. Nothing further is needed

## 2013-04-21 ENCOUNTER — Ambulatory Visit (HOSPITAL_COMMUNITY)
Admission: RE | Admit: 2013-04-21 | Discharge: 2013-04-21 | Disposition: A | Discharge status: Home / Self Care | Payer: Medicare Other | Source: Ambulatory Visit | Attending: Internal Medicine | Admitting: Internal Medicine

## 2013-04-21 ENCOUNTER — Other Ambulatory Visit (HOSPITAL_COMMUNITY): Payer: Self-pay | Admitting: Internal Medicine

## 2013-04-21 DIAGNOSIS — J45909 Unspecified asthma, uncomplicated: Secondary | ICD-10-CM | POA: Insufficient documentation

## 2013-06-21 ENCOUNTER — Telehealth: Payer: Self-pay | Admitting: Critical Care Medicine

## 2013-06-21 MED ORDER — LEVALBUTEROL TARTRATE 45 MCG/ACT IN AERO
INHALATION_SPRAY | RESPIRATORY_TRACT | Status: DC
Start: 2013-06-21 — End: 2013-09-24

## 2013-06-21 NOTE — Telephone Encounter (Signed)
Rx was refilled  [facility]

## 2013-07-10 ENCOUNTER — Emergency Department (HOSPITAL_COMMUNITY): Payer: Medicare Other

## 2013-07-10 ENCOUNTER — Emergency Department (HOSPITAL_COMMUNITY)
Admission: EM | Admit: 2013-07-10 | Discharge: 2013-07-10 | Disposition: A | Discharge status: Home / Self Care | Payer: Medicare Other | Attending: Emergency Medicine | Admitting: Emergency Medicine

## 2013-07-10 ENCOUNTER — Encounter (HOSPITAL_COMMUNITY): Payer: Self-pay | Admitting: Emergency Medicine

## 2013-07-10 DIAGNOSIS — F17200 Nicotine dependence, unspecified, uncomplicated: Secondary | ICD-10-CM | POA: Diagnosis not present

## 2013-07-10 DIAGNOSIS — Z862 Personal history of diseases of the blood and blood-forming organs and certain disorders involving the immune mechanism: Secondary | ICD-10-CM | POA: Diagnosis not present

## 2013-07-10 DIAGNOSIS — J441 Chronic obstructive pulmonary disease with (acute) exacerbation: Secondary | ICD-10-CM | POA: Diagnosis not present

## 2013-07-10 DIAGNOSIS — G40909 Epilepsy, unspecified, not intractable, without status epilepticus: Secondary | ICD-10-CM | POA: Diagnosis not present

## 2013-07-10 DIAGNOSIS — Z7902 Long term (current) use of antithrombotics/antiplatelets: Secondary | ICD-10-CM | POA: Diagnosis not present

## 2013-07-10 DIAGNOSIS — R071 Chest pain on breathing: Secondary | ICD-10-CM | POA: Diagnosis not present

## 2013-07-10 DIAGNOSIS — Z79899 Other long term (current) drug therapy: Secondary | ICD-10-CM | POA: Insufficient documentation

## 2013-07-10 DIAGNOSIS — G2581 Restless legs syndrome: Secondary | ICD-10-CM | POA: Insufficient documentation

## 2013-07-10 DIAGNOSIS — IMO0002 Reserved for concepts with insufficient information to code with codable children: Secondary | ICD-10-CM | POA: Insufficient documentation

## 2013-07-10 DIAGNOSIS — Z8673 Personal history of transient ischemic attack (TIA), and cerebral infarction without residual deficits: Secondary | ICD-10-CM | POA: Diagnosis not present

## 2013-07-10 DIAGNOSIS — Z8739 Personal history of other diseases of the musculoskeletal system and connective tissue: Secondary | ICD-10-CM | POA: Diagnosis not present

## 2013-07-10 DIAGNOSIS — R0789 Other chest pain: Secondary | ICD-10-CM

## 2013-07-10 DIAGNOSIS — K219 Gastro-esophageal reflux disease without esophagitis: Secondary | ICD-10-CM | POA: Diagnosis not present

## 2013-07-10 DIAGNOSIS — R079 Chest pain, unspecified: Secondary | ICD-10-CM | POA: Diagnosis present

## 2013-07-10 DIAGNOSIS — Z8639 Personal history of other endocrine, nutritional and metabolic disease: Secondary | ICD-10-CM | POA: Insufficient documentation

## 2013-07-10 MED ORDER — NAPROXEN SODIUM 220 MG PO TABS: ORAL_TABLET | ORAL | Status: DC

## 2013-07-10 MED ORDER — IPRATROPIUM BROMIDE 0.02 % IN SOLN
0.5000 mg | Freq: Once | RESPIRATORY_TRACT | Status: AC
  Administered 2013-07-10: 0.5 mg via RESPIRATORY_TRACT
  Filled 2013-07-10: qty 2.5

## 2013-07-10 MED ORDER — ALBUTEROL SULFATE (2.5 MG/3ML) 0.083% IN NEBU
5.0000 mg | INHALATION_SOLUTION | Freq: Once | RESPIRATORY_TRACT | Status: AC
  Administered 2013-07-10: 5 mg via RESPIRATORY_TRACT
  Filled 2013-07-10: qty 6

## 2013-07-10 MED ORDER — KETOROLAC TROMETHAMINE 60 MG/2ML IM SOLN
30.0000 mg | Freq: Once | INTRAMUSCULAR | Status: AC
  Administered 2013-07-10: 30 mg via INTRAMUSCULAR
  Filled 2013-07-10: qty 2

## 2013-07-10 NOTE — Discharge Instructions (Signed)

## 2013-07-10 NOTE — ED Notes (Signed)
Patient is alert and oriented x3.  He was given DC instructions and follow up visit instructions.  Patient gave verbal understanding.  He was DC ambulatory under his own power to home.  V/S stable.  He was not showing any signs of distress on DC 

## 2013-07-10 NOTE — ED Notes (Signed)
Patient states last week he attempted to pull something and hurt his chest, thought perhaps it was a bruise. Patient states his pain has worsened. Patient has been taking 45mg  Morphine TID for chronic back problems.

## 2013-07-10 NOTE — ED Provider Notes (Signed)
CSN: [id number]     Arrival date & time 07/10/13  0105 History   First MD Initiated Contact with Patient 07/10/13 0134     Chief Complaint  Patient presents with  . Chest Pain    ?pulled muscle     (Consider location/radiation/quality/duration/timing/severity/associated sxs/prior Treatment) HPI This is a 63 year old male with COPD. He was attempting to move something about a week ago and in the act of pulling felt something pull in the center of his chest. Since then he has had pain across his chest which he describes as severe. It is most intense along the left costosternal margin. It is worse with movement, breathing or palpation. He is short of breath but states this is due to COPD. He is on chronic morphine extended release 45 mg per day as well as diazepam and Lyrica; these are all prescribed by [facility]illips [facility].  Past Medical History  Diagnosis Date  . Hyponatremia   . Anemia   . Incontinence, feces   . Lumbar back pain   . Alcohol abuse   . HH (hiatus hernia)   . Chronic bronchitis   . TIA (transient ischemic attack)   . Osteoporosis   . Seizure disorder   . Hypertension   . GERD (gastroesophageal reflux disease)   . COPD (chronic obstructive pulmonary disease)   . DDD (degenerative disc disease), cervical   . RLS (restless legs syndrome)   . Seizures    Past Surgical History  Procedure Laterality Date  . Rotator cuff repair      left  . Lumbar laminectomy/decompression microdiscectomy    . Inguinal hernia repair  02/28/95    rt   Family History  Problem Relation Age of Onset  . Bone cancer Mother   . Coronary artery disease     History  Substance Use Topics  . Smoking status: Current Every Day Smoker -- 1.00 packs/day    Types: Cigarettes  . Smokeless tobacco: Never Used  . Alcohol Use: No     Comment: pt quit 5 years ago.     Review of Systems  All other systems reviewed and are negative.   Allergies  Review of patient's  allergies indicates no known allergies.  Home Medications   Prior to Admission medications   Medication Sig Start Date End Date Taking? Authorizing Provider  benazepril (LOTENSIN) 20 MG tablet Take 20 mg by mouth daily.      Historical Provider, MD  budesonide-formoterol [facility](SYMBICORT) 160-4.5 MCG/ACT inhaler inhale 2 puffs by mouth twice a day 08/29/12   Storm FriskPatrick E Wright, MD  clopidogrel (PLAVIX) 75 MG tablet Take 75 mg by mouth daily.      Historical Provider, MD  diazepam (VALIUM) 5 MG tablet Take 5 mg by mouth every 12 (twelve) hours as needed.     Historical Provider, MD  furosemide (LASIX) 40 MG tablet Take 40 mg by mouth daily.     Historical Provider, MD  levalbuterol [facility](XOPENEX HFA) 45 MCG/ACT inhaler inhale 2 puffs by mouth every 6 hours if needed for wheezing 06/21/13   Storm FriskPatrick E Wright, MD  morphine (MSIR) 15 MG tablet Take 15 mg by mouth every 4 (four) hours as needed for pain.    Historical Provider, MD  morphine (MSIR) 30 MG tablet Take 30 mg by mouth 3 (three) times daily. Pt stated the morphine was changed to 45mg  he thinks.    Historical Provider, MD  omeprazole (PRILOSEC) 20 MG capsule take 1 capsule  by mouth once daily 11/17/10   Storm FriskPatrick E Wright, MD  phenytoin (DILANTIN) 100 MG ER capsule Take 5 capsules po daily as directed 10/03/12   Nilda RiggsNancy Carolyn Martin, NP  pregabalin (LYRICA) 75 MG capsule Take 75 mg by mouth every 8 (eight) hours.      Historical Provider, MD  rOPINIRole (REQUIP) 4 MG tablet Take 4 mg by mouth at bedtime.      Historical Provider, MD   BP 142/69  Pulse 78  Temp(Src) 98.6 F (37 C) (Oral)  Resp 18  SpO2 98%  Physical Exam General: Well-developed, well-nourished male in no acute distress; appearance consistent with age of record HENT: normocephalic; atraumatic Eyes: pupils equal, round and reactive to light; extraocular muscles intact Neck: supple Heart: regular rate and rhythm Lungs: Inspiratory and expiratory wheezes Chest: Anterior chest wall  tenderness, notably along the left costosternal border Abdomen: soft; nondistended Extremities: No deformity; full range of motion; pulses normal Neurologic: Awake, alert and oriented; motor function intact in all extremities and symmetric; no facial droop Skin: Warm and dry Psychiatric: Flat affect    ED Course  Procedures (including critical care time)   EKG Interpretation   Date/Time:  Tuesday July 10 2013 01:13:04 EDT Ventricular Rate:  75 PR Interval:  62 QRS Duration: 108 QT Interval:  371 QTC Calculation: 414 R Axis:   79 Text Interpretation:  Sinus rhythm Short PR interval Minimal ST elevation,  anterior leads No significant change was found Confirmed by Oneda Duffett  MD,  Jonny RuizJOHN ([id number]) on 07/10/2013 1:18:39 AM      MDM  Nursing notes and vitals signs, including pulse oximetry, reviewed.  Summary of this visit's results, reviewed by myself:  Labs:  No results found for this or any previous visit (from the past 24 hour(s)).  Imaging Studies: Dg Ribs Unilateral W/chest Left  07/10/2013   CLINICAL DATA:  Left pleuritic chest pain.  EXAM: LEFT RIBS AND CHEST - 3+ VIEW  COMPARISON:  04/21/2013  FINDINGS: No fracture or other bone lesions are seen involving the ribs. There is no evidence of pneumothorax or pleural effusion.  Chronic pulmonary interstitial prominence is unchanged. No evidence of acute infiltrate or edema. Heart size and mediastinal contours are within normal limits.  IMPRESSION: No acute findings.   Electronically Signed   By: Myles RosenthalJohn  Stahl M.D.   On: 07/10/2013 02:05   2:52 AM Patient's pain improved with IM Toradol. Wheezing resolved after albuterol and Atrovent neb treatment.     Hanley SeamenJohn L Deklen Popelka, MD 07/10/13 [phone]

## 2013-07-19 ENCOUNTER — Other Ambulatory Visit: Payer: Self-pay | Admitting: Critical Care Medicine

## 2013-07-20 ENCOUNTER — Telehealth: Payer: Self-pay | Admitting: Critical Care Medicine

## 2013-07-20 NOTE — Telephone Encounter (Signed)
Spoke with pharmacist at [facility]  She states that each box of albuterol comes with 75 ml  We sent in rx for 120 ml  She wanted to see if we could give 2 boxes and I advised that this is fine  Nothing further needed

## 2013-09-24 ENCOUNTER — Other Ambulatory Visit: Payer: Self-pay | Admitting: Critical Care Medicine

## 2013-09-27 NOTE — Telephone Encounter (Signed)
Received refill request for Symbicort and Xopenex. Last OV with PW - 08/2012; asked to f/u in 1 yr. No pending appt. Called, spoke with pt -  Appt scheduled to see Dr. [name] on Oct 31, 2013 at 11:45 at [facility].  Pt confirmed appt and is aware rxs sent to pharm to last until appt.  He is aware to call office back if anything further is needed.

## 2013-10-03 ENCOUNTER — Encounter (INDEPENDENT_AMBULATORY_CARE_PROVIDER_SITE_OTHER): Payer: Self-pay

## 2013-10-03 ENCOUNTER — Ambulatory Visit (INDEPENDENT_AMBULATORY_CARE_PROVIDER_SITE_OTHER): Payer: Medicare Other | Admitting: Nurse Practitioner

## 2013-10-03 ENCOUNTER — Encounter: Payer: Self-pay | Admitting: Nurse Practitioner

## 2013-10-03 VITALS — BP 120/72 | HR 66 | Ht 69.0 in | Wt 161.8 lb

## 2013-10-03 DIAGNOSIS — R569 Unspecified convulsions: Secondary | ICD-10-CM

## 2013-10-03 DIAGNOSIS — Z5181 Encounter for therapeutic drug level monitoring: Secondary | ICD-10-CM

## 2013-10-03 MED ORDER — PHENYTOIN SODIUM EXTENDED 100 MG PO CAPS: ORAL_CAPSULE | ORAL | Status: DC

## 2013-10-03 NOTE — Patient Instructions (Signed)
Continue Dilantin at current dose Check labs today Call for any seizure activity Followup yearly and when necessary

## 2013-10-03 NOTE — Progress Notes (Signed)
[facility]  PATIENT: Ray Green DOB: Jul 12, 1950   REASON FOR VISIT: followup for seizure disorder    HISTORY OF PRESENT ILLNESS:Ray Green, 63 year old right-handed white male with a history of seizures returns for followup. He is alone at today's visit. The patient has done well since last seen without recurrence of seizures. The patient has not had a seizure in over 8.5 years. The patient is on Dilantin, and is tolerating the medication very well. The patient does have a history of osteoporosis, but he has stopped taking Fosamax, and he is no longer on calcium and vitamin D supplementation because they caused constipation. The patient has not had any recent falls. The patient has chronic back pain currently on morphine.    REVIEW OF SYSTEMS: Full 14 system review of systems performed and notable only for those listed, all others are neg:  Constitutional: N/A  Cardiovascular: N/A  Ear/Nose/Throat: N/A  Skin: N/A  Eyes: N/A  Respiratory: N/A  Gastroitestinal: N/A  Hematology/Lymphatic: N/A  Endocrine: N/A Musculoskeletal:N/A  Allergy/Immunology: N/A  Neurological: N/A Psychiatric: N/A Sleep : restless legs   ALLERGIES: No Known Allergies  HOME MEDICATIONS: Outpatient Prescriptions Prior to Visit  Medication Sig Dispense Refill  . albuterol (PROVENTIL) (2.5 MG/3ML) 0.083% nebulizer solution inhale contents of 1 vial in nebulizer every 6 hours if needed for wheezing  120 mL  1  . benazepril (LOTENSIN) 20 MG tablet Take 20 mg by mouth daily.        . clopidogrel (PLAVIX) 75 MG tablet Take 75 mg by mouth daily.        . diazepam (VALIUM) 5 MG tablet Take 5 mg by mouth every 12 (twelve) hours as needed.       . furosemide (LASIX) 40 MG tablet Take 40 mg by mouth daily.       [name] morphine (MSIR) 15 MG tablet Take 15 mg by mouth 3 (three) times daily as needed for moderate pain (take along with 30mg  MSIR).       [name] morphine (MSIR) 30 MG tablet Take 30 mg by  mouth 3 (three) times daily as needed for moderate pain (take along with 15mg  MSIR).       . naproxen sodium (ALEVE) 220 MG tablet Take 2 tablets twice daily as needed for chest wall pain. Best taken with a meal.      . omeprazole (PRILOSEC) 20 MG capsule take 1 capsule by mouth once daily  30 capsule  6  . phenytoin (DILANTIN) 100 MG ER capsule Take 5 capsules po daily as directed  150 capsule  11  . pregabalin (LYRICA) 75 MG capsule Take 75 mg by mouth every 8 (eight) hours.        [name] rOPINIRole (REQUIP) 4 MG tablet Take 4 mg by mouth at bedtime.        . SYMBICORT 160-4.5 MCG/ACT inhaler inhale 2 puffs by mouth twice a day  10.2 g  1  . Vitamin D, Ergocalciferol, (DRISDOL) 50000 UNITS CAPS capsule Take 50,000 Units by mouth 2 (two) times a week.      Pauline Aus HFA 45 MCG/ACT inhaler inhale 2 puffs every 6 hours if needed for wheezing  15 g  0   No facility-administered medications prior to visit.    PAST MEDICAL HISTORY: Past Medical History  Diagnosis Date  . Hyponatremia   . Anemia   . Incontinence, feces   . Lumbar back pain   . Alcohol abuse   .  HH (hiatus hernia)   . Chronic bronchitis   . TIA (transient ischemic attack)   . Osteoporosis   . Seizure disorder   . Hypertension   . GERD (gastroesophageal reflux disease)   . COPD (chronic obstructive pulmonary disease)   . DDD (degenerative disc disease), cervical   . RLS (restless legs syndrome)   . Seizures     PAST SURGICAL HISTORY: Past Surgical History  Procedure Laterality Date  . Rotator cuff repair      left  . Lumbar laminectomy/decompression microdiscectomy    . Inguinal hernia repair  02/28/95    rt    FAMILY HISTORY: Family History  Problem Relation Age of Onset  . Bone cancer Mother   . Coronary artery disease      SOCIAL HISTORY: History   Social History  . Marital Status: Divorced    Spouse Name: N/A    Number of Children: 1  . Years of Education: 12th   Occupational History  . disabled      Social History Main Topics  . Smoking status: Current Every Day Smoker -- 1.00 packs/day    Types: Cigarettes  . Smokeless tobacco: Never Used  . Alcohol Use: No     Comment: pt quit 5 years ago.   . Drug Use: No  . Sexual Activity: Not on file   Other Topics Concern  . Not on file   Social History Narrative   Patient is divorced and has 1 child.    Patient is single.    Patient has a high school education.      PHYSICAL EXAM  Filed Vitals:   10/03/13 1501  BP: 120/72  Pulse: 66  Height: 5\' 9"  (1.753 m)  Weight: 161 lb 12.8 oz (73.392 kg)   Body mass index is 23.88 kg/(m^2). Generalized: Well developed, in no acute distress  Musculoskeletal: No deformity  Neurological examination  Mentation: Alert oriented to time, place, history taking. Follows all commands speech and language fluent  Cranial nerve II-XII: Pupils were equal round reactive to light extraocular movements were full, visual field were full on confrontational test. Facial sensation and strength were normal. hearing was intact to finger rubbing bilaterally. Uvula tongue midline. head turning and shoulder shrug and were normal and symmetric.Tongue protrusion into cheek strength was normal.  Motor: normal bulk and tone, full strength in the BUE, BLE, fine finger movements normal, no pronator drift. No focal weakness  Coordination: finger-nose-finger, heel-to-shin bilaterally, no dysmetria  Reflexes: 1+ upper and lower and symmetric  Gait and Station: Rising up from seated position without assistance, normal stance, moderate stride, good arm swing, smooth turning, able to perform tiptoe, and heel walking without difficulty. Tandem gait is unsteady  DIAGNOSTIC DATA (LABS, IMAGING, TESTING) - I reviewed patient records, labs, notes, testing and imaging myself where available.  Lab Results  Component Value Date   WBC 7.7 10/03/2012   HGB 13.2 10/03/2012   HCT 37.0* 10/03/2012   MCV 90 10/03/2012   PLT 287  01/11/2012      Component Value Date/Time   NA 132* 10/03/2012 1616   NA 129* 01/11/2012 0103   K 3.9 10/03/2012 1616   CL 93* 10/03/2012 1616   CO2 24 10/03/2012 1616   GLUCOSE 82 10/03/2012 1616   GLUCOSE 94 01/11/2012 0103   BUN 6* 10/03/2012 1616   BUN 7 01/11/2012 0103   CREATININE 0.64* 10/03/2012 1616   CALCIUM 9.3 10/03/2012 1616   CALCIUM 9.0 01/22/2009 0000  PROT 6.7 10/03/2012 1616   PROT 7.5 01/11/2012 0103   ALBUMIN 3.7 01/11/2012 0103   AST 17 10/03/2012 1616   ALT 11 10/03/2012 1616   ALKPHOS 123* 10/03/2012 1616   BILITOT 0.2 10/03/2012 1616   GFRNONAA 104 10/03/2012 1616   GFRAA 120 10/03/2012 1616   ASSESSMENT AND PLAN  63 y.o. year old male  has a past medical history of Seizure disorder; here to follow up.   Continue Dilantin at current dose Check labs today, CBC, CMP and Dilantin Call for any seizure activity Followup yearly and when necessary Nilda RiggsNancy Carolyn Azaryah Heathcock, [facility] Medical CenterGNP, [facility]BC, APRN  [facility] [address], [address] [place], [place] [address] [phone]

## 2013-10-03 NOTE — Progress Notes (Signed)
I have read the note, and I agree with the clinical assessment and plan.  WILLIS,CHARLES KEITH   

## 2013-10-04 LAB — COMPREHENSIVE METABOLIC PANEL
ALT: 10 IU/L (ref 0–44)
AST: 15 IU/L (ref 0–40)
Albumin/Globulin Ratio: 1.6 (ref 1.1–2.5)
Albumin: 4.1 g/dL (ref 3.6–4.8)
Alkaline Phosphatase: 101 IU/L (ref 39–117)
BUN/Creatinine Ratio: 14 (ref 10–22)
BUN: 9 mg/dL (ref 8–27)
CHLORIDE: 98 mmol/L (ref 97–108)
CO2: 20 mmol/L (ref 18–29)
Calcium: 8.9 mg/dL (ref 8.6–10.2)
Creatinine, Ser: 0.63 mg/dL — ABNORMAL LOW (ref 0.76–1.27)
GFR calc non Af Amer: 104 mL/min/{1.73_m2} (ref >59)
GFR, EST AFRICAN AMERICAN: 120 mL/min/{1.73_m2} (ref >59)
GLUCOSE: 118 mg/dL — AB (ref 65–99)
Globulin, Total: 2.5 g/dL (ref 1.5–4.5)
POTASSIUM: 4.1 mmol/L (ref 3.5–5.2)
Sodium: 135 mmol/L (ref 134–144)
TOTAL PROTEIN: 6.6 g/dL (ref 6.0–8.5)
Total Bilirubin: 0.3 mg/dL (ref 0.0–1.2)

## 2013-10-04 LAB — CBC WITH DIFFERENTIAL/PLATELET
BASOS ABS: 0 10*3/uL (ref 0.0–0.2)
Basos: 1 %
EOS ABS: 0.1 10*3/uL (ref 0.0–0.4)
Eos: 2 %
HCT: 37.4 % — ABNORMAL LOW (ref 37.5–51.0)
HEMOGLOBIN: 12.7 g/dL (ref 12.6–17.7)
IMMATURE GRANULOCYTES: 0 %
Immature Grans (Abs): 0 10*3/uL (ref 0.0–0.1)
LYMPHS: 21 %
Lymphocytes Absolute: 1.4 10*3/uL (ref 0.7–3.1)
MCH: 30.8 pg (ref 26.6–33.0)
MCHC: 34 g/dL (ref 31.5–35.7)
MCV: 91 fL (ref 79–97)
MONOCYTES: 11 %
Monocytes Absolute: 0.7 10*3/uL (ref 0.1–0.9)
NEUTROS ABS: 4.2 10*3/uL (ref 1.4–7.0)
Neutrophils Relative %: 65 %
RBC: 4.13 x10E6/uL — ABNORMAL LOW (ref 4.14–5.80)
RDW: 13.5 % (ref 12.3–15.4)
WBC: 6.5 10*3/uL (ref 3.4–10.8)

## 2013-10-04 LAB — PHENYTOIN LEVEL, TOTAL: PHENYTOIN LVL: 25.5 ug/mL — AB (ref 10.0–20.0)

## 2013-10-15 ENCOUNTER — Other Ambulatory Visit: Payer: Self-pay | Admitting: Nurse Practitioner

## 2013-10-29 ENCOUNTER — Other Ambulatory Visit: Payer: Self-pay | Admitting: Critical Care Medicine

## 2013-10-30 NOTE — Telephone Encounter (Signed)
Last OV with Dr. Delford FieldWright: 08/29/12 Pending OV with Dr. Delford FieldWright: 10/31/2013

## 2013-10-31 ENCOUNTER — Ambulatory Visit: Payer: Medicare Other | Admitting: Critical Care Medicine

## 2013-11-09 ENCOUNTER — Telehealth: Payer: Self-pay | Admitting: Critical Care Medicine

## 2013-11-09 NOTE — Telephone Encounter (Signed)
Called pt. appt scheduled for 11/16 at 3:45. Pt aware he needs to bring ALL meds in hand with him or appt will be r/s. He voiced understanding

## 2013-11-09 NOTE — Telephone Encounter (Signed)
He needs to transfer to another provider

## 2013-11-09 NOTE — Telephone Encounter (Addendum)
Called spoke with pt. He NS for appt w/ PW on 10/31/13. Pt reports he can not come in until after 2:30 in the afternoon since his aid will have to bring him.  Pt was very upset b/c Dr. Delford FieldWright did not have an appt in the afternoons. Pt very rude on the phone and was cursing.   Can pt come in and see TP for follow up OV since pt has not been seen in over year? Please advise Dr. Delford FieldWright thanks

## 2013-11-09 NOTE — Telephone Encounter (Signed)
I can see him but he'll have to bring all of his medications with him and if he fails to do so he'll need to reschedule for another day (If that's not agreeable to him then he'll have to see someone else)

## 2013-11-09 NOTE — Telephone Encounter (Signed)
Called spoke with pt. Advised him since PW is not in the office in the afternoons, it would be best if he transfers his care to another provider. He did not have a preference. Please advise MW thanks

## 2013-11-19 ENCOUNTER — Ambulatory Visit (INDEPENDENT_AMBULATORY_CARE_PROVIDER_SITE_OTHER): Payer: Medicare Other | Admitting: Internal Medicine

## 2013-11-19 ENCOUNTER — Encounter: Payer: Self-pay | Admitting: Internal Medicine

## 2013-11-19 VITALS — BP 112/68 | HR 68 | Ht 72.0 in | Wt 158.0 lb

## 2013-11-19 DIAGNOSIS — I10 Essential (primary) hypertension: Secondary | ICD-10-CM

## 2013-11-19 DIAGNOSIS — J449 Chronic obstructive pulmonary disease, unspecified: Secondary | ICD-10-CM

## 2013-11-19 DIAGNOSIS — F17210 Nicotine dependence, cigarettes, uncomplicated: Secondary | ICD-10-CM

## 2013-11-19 NOTE — Progress Notes (Signed)
  Subjective:    Patient ID: Ray Green, male    DOB: [birth year] .   MRN: [id number]    Brief patient profile:  [age] yowm active smoker referred by Dr Delford FieldWright to pulmonary clinic 11/19/13 to eval ? chronic obstructive lung disease and asthmatic bronchitic component.   History of Present Illness  11/19/2013 1st  office visit/ Ray Green  / referred by Dr Delford FieldWright / did not bring meds as requested "I have a list at home" Chief Complaint  Patient presents with  . Pulmonary Consult    Former pt of Dr Delford FieldWright. Pt states has no co's today.   pt still smoking and insists he has no cough or sob but demonstrated a harsh barking quality dry cough at ov and using symbicort/xopenex both as maint with poor hfa (see a/p)  Still able to get around but avoids exertion "because of my back" - Not limited by breathing from desired activities    No obvious day to day or daytime variabilty or assoc   cp or chest tightness, subjective wheeze overt sinus or hb symptoms. No unusual exp hx or h/o childhood pna/ asthma or knowledge of premature birth.  Sleeping ok without nocturnal  or early am exacerbation  of respiratory  c/o's or need for noct saba. Also denies any obvious fluctuation of symptoms with weather or environmental changes or other aggravating or alleviating factors except as outlined above   Current Medications, Allergies, Complete Past Medical History, Past Surgical History, Family History, and Social History were reviewed in [facility] electronic medical record.  ROS  The following are not active complaints unless bolded sore throat, dysphagia, dental problems, itching, sneezing,  nasal congestion or excess/ purulent secretions, ear ache,   fever, chills, sweats, unintended wt loss, pleuritic or exertional cp, hemoptysis,  orthopnea pnd or leg swelling, presyncope, palpitations, heartburn, abdominal pain, anorexia, nausea, vomiting, diarrhea  or change in bowel or urinary habits, change in stools or  urine, dysuria,hematuria,  rash, arthralgias, visual complaints, headache, numbness weakness or ataxia or problems with walking or coordination,  change in mood/affect or memory.         PEx  Cantankerous Patient who failed to answer a single question asked in a straightforward manner, tending to go off on tangents or answer questions with ambiguous medical terms or diagnoses and seemed aggravated  when asked the same question more than once for clarification.   Wt Readings from Last 3 Encounters:  11/19/13 158 lb (71.668 kg)  10/03/13 161 lb 12.8 oz (73.392 kg)  10/03/12 168 lb (76.204 kg)    Vital signs reviewed  HEENT: nl dentition, turbinates, and orophanx. Nl external ear canals without cough reflex   NECK :  without JVD/Nodes/TM/ nl carotid upstrokes bilaterally   LUNGS: no acc muscle use, clear to A and P bilaterally without cough on insp or exp maneuvers   CV:  RRR  no s3 or murmur or increase in P2, no edema   ABD:  soft and nontender with nl excursion in the supine position. No bruits or organomegaly, bowel sounds nl  MS:  warm without deformities, calf tenderness, cyanosis or clubbing  SKIN: warm and dry without lesions    NEURO:  alert, approp, no deficits     cxr declined, agreed to have one on return            Assessment & Plan:

## 2013-11-19 NOTE — Patient Instructions (Addendum)
Plan A = automatic = Symbicort 160 Take 2 puffs first thing in am and then another 2 puffs about 12 hours later.   Plan B= backup Only use your levoalbuterol as a rescue medication to be used if you can't catch your breath by resting or doing a relaxed purse lip breathing pattern.  - The less you use it, the better it will work when you need it. - Ok to use up to 2 puffs  every 4 hours if you must but call for immediate appointment if use goes up over your usual need - Don't leave home without it !!  (think of it like the spare tire for your car)   Work on inhaler technique:  relax and gently blow all the way out then take a nice smooth deep breath back in, triggering the inhaler at same time you start breathing in.  Hold for up to 5 seconds if you can.  Rinse and gargle with water when done    I will let Dr August Saucerean know I very strongly you get off the lisinopril and on an alternative that will need to be monitored   I need to see you back in 2 weeks to regroup with all of your medications for cxr and consider different alternatives for long term treatment depending on your response to the above recommendations

## 2013-11-20 DIAGNOSIS — F17210 Nicotine dependence, cigarettes, uncomplicated: Secondary | ICD-10-CM | POA: Insufficient documentation

## 2013-11-20 NOTE — Assessment & Plan Note (Signed)
DDX of  difficult airways management all start with A and  include Adherence, Ace Inhibitors, Acid Reflux, Active Sinus Disease, Alpha 1 Antitripsin deficiency, Anxiety masquerading as Airways dz,  ABPA,  allergy(esp in young), Aspiration (esp in elderly), Adverse effects of DPI,  Active smokers, plus two Bs  = Bronchiectasis and Beta blocker use..and one C= CHF  Adherence is always the initial "prime suspect" and is a multilayered concern that requires a "trust but verify" approach in every patient - starting with knowing how to use medications, especially inhalers, correctly, keeping up with refills and understanding the fundamental difference between maintenance and prns vs those medications only taken for a very short course and then stopped and not refilled.  - The proper method of use, as well as anticipated side effects, of a metered-dose inhaler are discussed and demonstrated to the patient. Improved effectiveness after extensive coaching during this visit to a level of approximately  75% from a baseline of < 50% so still needs work - misunderstood how to use maint vs prns, reviewed  Active smoking > discussed separately  ACEi > strongly suspect playing a role in symptoms and the only way to tell is trial off x 6 weeks min   Anxiety/ pysch issues also apparent here and may interfere with his care  I told him I was willing to see him again if he'll follow instructions (see instructions)

## 2013-11-20 NOTE — Assessment & Plan Note (Signed)
ACE inhibitors are problematic in  pts with airway complaints because  even experienced pulmonologists can't always distinguish ace effects from copd/asthma.  By themselves they don't actually cause a problem, much like oxygen can't by itself start a fire, but they certainly serve as a powerful catalyst or enhancer for any "fire"  or inflammatory process in the upper airway, be it caused by an ET  tube or more commonly reflux (especially in the obese or pts with known GERD or who are on biphoshonates).    In the era of ARB near equivalency until we have a better handle on the reversibility of the airway problem, it just makes sense to avoid ACEI  entirely in the short run - this is the only way to tell about cause and effect

## 2013-11-20 NOTE — Assessment & Plan Note (Signed)

## 2013-11-21 ENCOUNTER — Encounter: Payer: Self-pay | Admitting: Neurology

## 2013-11-27 ENCOUNTER — Encounter: Payer: Self-pay | Admitting: Neurology

## 2013-12-03 ENCOUNTER — Ambulatory Visit (INDEPENDENT_AMBULATORY_CARE_PROVIDER_SITE_OTHER)
Admission: RE | Admit: 2013-12-03 | Discharge: 2013-12-03 | Disposition: A | Discharge status: Home / Self Care | Payer: Medicare Other | Source: Ambulatory Visit | Attending: Internal Medicine | Admitting: Internal Medicine

## 2013-12-03 ENCOUNTER — Ambulatory Visit (INDEPENDENT_AMBULATORY_CARE_PROVIDER_SITE_OTHER): Payer: Medicare Other | Admitting: Internal Medicine

## 2013-12-03 ENCOUNTER — Encounter: Payer: Self-pay | Admitting: Internal Medicine

## 2013-12-03 VITALS — BP 122/80 | HR 70 | Ht 69.0 in | Wt 162.0 lb

## 2013-12-03 DIAGNOSIS — Z720 Tobacco use: Secondary | ICD-10-CM | POA: Diagnosis not present

## 2013-12-03 DIAGNOSIS — J449 Chronic obstructive pulmonary disease, unspecified: Secondary | ICD-10-CM

## 2013-12-03 DIAGNOSIS — F17210 Nicotine dependence, cigarettes, uncomplicated: Secondary | ICD-10-CM

## 2013-12-03 DIAGNOSIS — I10 Essential (primary) hypertension: Secondary | ICD-10-CM

## 2013-12-03 NOTE — Patient Instructions (Addendum)
Make sure you take your prilosec Take 30-60 min before first meal of the day   Plan A = automatic = Symbicort 160 Take 2 puffs first thing in am and then another 2 puffs about 12 hours later.   Plan B= backup Only use your levoalbuterol as a rescue medication to be used if you can't catch your breath by resting or doing a relaxed purse lip breathing pattern.  - The less you use it, the better it will work when you need it. - Ok to use up to 2 puffs  every 4 hours if you must but call for immediate appointment if use goes up over your usual need - Don't leave home without it !!  (think of it like the spare tire for your car)   Plan C= contingency = use the nebulizer with albuterol up to every 4 hours if the levoalbuterol fails to relieve your breathing   Work on inhaler technique:  relax and gently blow all the way out then take a nice smooth deep breath back in, triggering the inhaler at same time you start breathing in.  Hold for up to 5 seconds if you can.  Rinse and gargle with water when done  Please remember to go to the   x-ray department downstairs for your tests - we will call you with the results when they are available.    Please schedule a follow up office visit in 6 weeks, call sooner if needed with pfts

## 2013-12-03 NOTE — Progress Notes (Signed)
Subjective:    Patient ID: Ray Green, male    DOB: 1950/07/26 .   MRN: [id number]    Brief patient profile:  [age] yowm active smoker referred by Dr Delford FieldWright to pulmonary clinic 11/19/13 to eval ? chronic obstructive lung disease and asthmatic bronchitic component.     History of Present Illness  11/19/2013 1st  office visit/ Ray Green  / referred by Dr Delford FieldWright / did not bring meds as requested "I have a list at home" Chief Complaint  Patient presents with  . Pulmonary Consult    Former pt of Dr Delford FieldWright. Pt states has no co's today.   pt still smoking and insists he has no cough or sob but demonstrated a harsh barking quality dry cough at ov and using symbicort/xopenex both as maint with poor hfa (see a/p) still able to get around but avoids exertion "because of my back"  rec Plan A = automatic = Symbicort 160 Take 2 puffs first thing in am and then another 2 puffs about 12 hours later.  Plan B= backup Only use your levoalbuterol as a rescue medication   Work on inhaler technique  I will let Dr August Saucerean know I very strongly you get off the lisinopril> pt d/c'd ? When (pt not sure)    12/03/2013 f/u ov/Ray Green:GNFAre:copd with  doe x mailbox and back ("maybe a hundred feet")/ still smokig  Also reports can walk around apt x 15 m s stopping  Still confused on how to use saba and symbicort / reviewed also with daughter     No obvious day to day or daytime variabilty or assoc excess / purulent sputum   cp or chest tightness, subjective wheeze overt sinus or hb symptoms. No unusual exp hx or h/o childhood pna/ asthma or knowledge of premature birth.  Sleeping ok without nocturnal  or early am exacerbation  of respiratory  c/o's or need for noct saba. Also denies any obvious fluctuation of symptoms with weather or environmental changes or other aggravating or alleviating factors except as outlined above   Current Medications, Allergies, Complete Past Medical History, Past Surgical History, Family  History, and Social History were reviewed in [facility] electronic medical record.  ROS  The following are not active complaints unless bolded sore throat, dysphagia, dental problems, itching, sneezing,  nasal congestion or excess/ purulent secretions, ear ache,   fever, chills, sweats, unintended wt loss, pleuritic or exertional cp, hemoptysis,  orthopnea pnd or leg swelling, presyncope, palpitations, heartburn, abdominal pain, anorexia, nausea, vomiting, diarrhea  or change in bowel or urinary habits, change in stools or urine, dysuria,hematuria,  rash, arthralgias, visual complaints, headache, numbness weakness or ataxia or problems with walking or coordination,  change in mood/affect or memory.         PEx  Cantankerous in a pleasant way today / fully ambulatory nad    12/03/2013      162 Wt Readings from Last 3 Encounters:  11/19/13 158 lb (71.668 kg)  10/03/13 161 lb 12.8 oz (73.392 kg)  10/03/12 168 lb (76.204 kg)    Vital signs reviewed  HEENT: nl dentition, turbinates, and orophanx. Nl external ear canals without cough reflex   NECK :  without JVD/Nodes/TM/ nl carotid upstrokes bilaterally   LUNGS: no acc muscle use, clear to A and P bilaterally without cough on insp or exp maneuvers   CV:  RRR  no s3 or murmur or increase in P2, no edema   ABD:  soft and nontender  with nl excursion in the supine position. No bruits or organomegaly, bowel sounds nl  MS:  warm without deformities, calf tenderness, cyanosis or clubbing  SKIN: warm and dry without lesions    NEURO:  alert, approp, no deficits     CXR  12/03/2013 : Emphysema without acute cardiopulmonary disease.             Assessment & Plan:

## 2013-12-03 NOTE — Progress Notes (Signed)
Quick Note:  Spoke with pt and notified of results per Dr. Wert. Pt verbalized understanding and denied any questions.  ______ 

## 2013-12-03 NOTE — Assessment & Plan Note (Addendum)
-    11/20/14 trial off acei strongly rec > d/c'd by ov 12/03/2013  - 12/03/2013  Walked RA x 3 laps @ 185 ft each stopped due to  End of study, nl pace, minimal sob s desat    DDX of  difficult airways management all start with A and  include Adherence, Ace Inhibitors, Acid Reflux, Active Sinus Disease, Alpha 1 Antitripsin deficiency, Anxiety masquerading as Airways dz,  ABPA,  allergy(esp in young), Aspiration (esp in elderly), Adverse effects of DPI,  Active smokers, plus two Bs  = Bronchiectasis and Beta blocker use..and one C= CHF  Adherence is always the initial "prime suspect" and is a multilayered concern that requires a "trust but verify" approach in every patient - starting with knowing how to use medications, especially inhalers, correctly, keeping up with refills and understanding the fundamental difference between maintenance and prns vs those medications only taken for a very short course and then stopped and not refilled.  The proper method of use, as well as anticipated side effects, of a metered-dose inhaler are discussed and demonstrated to the patient. Improved effectiveness after extensive coaching during this visit to a level of approximately  90% though note baseline is < 25%  ? ACEi > now off and needs to leave off for at least 6 weeks to sort out which symptoms are acei related  ? Acid (or non-acid) GERD > always difficult to exclude as up to 75% of pts in some series report no assoc GI/ Heartburn symptoms> rec cont max (24h)  acid suppression and diet restrictions/ reviewed and instructions given in writing.   ? Anxiety > usually dx of exclusion but note his activity tolerance worse to mailbox and back vs 15 min walking s stopping around the same appt complex where he goes to mailbox does not equal anything sounding like it's  proportional to intensity or duration of walking and not able to reproduce in office   Active smoking obviously a concern, discussed separatley   Will  return for full pfts and in meantime continue just the symbicort 160 2bid and prn saba, reviewed

## 2013-12-04 NOTE — Assessment & Plan Note (Signed)
>   3 m  I took an extended  opportunity with this patient to outline the consequences of continued cigarette use  in airway disorders based on all the data we have from the multiple national lung health studies (perfomed over decades at millions of dollars in cost)  indicating that smoking cessation, not choice of inhalers or physicians, is the most important aspect of care.   

## 2013-12-04 NOTE — Assessment & Plan Note (Signed)
Off acei as ov ov 12/03/2013  Due to concerns re pseudowheeze   Ok so far on just furosemide daily > continue/ monitor > defer rx to Dr August Saucerean

## 2013-12-06 ENCOUNTER — Other Ambulatory Visit: Payer: Self-pay | Admitting: Critical Care Medicine

## 2014-01-15 ENCOUNTER — Ambulatory Visit: Payer: Medicare Other | Admitting: Internal Medicine

## 2014-01-25 ENCOUNTER — Ambulatory Visit: Payer: Medicare Other | Admitting: Internal Medicine

## 2014-02-18 ENCOUNTER — Ambulatory Visit: Payer: Medicare Other | Admitting: Internal Medicine

## 2014-06-06 ENCOUNTER — Other Ambulatory Visit: Payer: Self-pay | Admitting: Internal Medicine

## 2014-07-03 ENCOUNTER — Other Ambulatory Visit: Payer: Self-pay | Admitting: Internal Medicine

## 2014-09-02 ENCOUNTER — Other Ambulatory Visit: Payer: Self-pay | Admitting: Internal Medicine

## 2014-09-06 ENCOUNTER — Encounter: Payer: Self-pay | Admitting: Internal Medicine

## 2014-09-06 ENCOUNTER — Ambulatory Visit (INDEPENDENT_AMBULATORY_CARE_PROVIDER_SITE_OTHER): Payer: Medicare Other | Admitting: Internal Medicine

## 2014-09-06 ENCOUNTER — Ambulatory Visit (INDEPENDENT_AMBULATORY_CARE_PROVIDER_SITE_OTHER)
Admission: RE | Admit: 2014-09-06 | Discharge: 2014-09-06 | Disposition: A | Discharge status: Home / Self Care | Payer: Medicare Other | Source: Ambulatory Visit | Attending: Internal Medicine | Admitting: Internal Medicine

## 2014-09-06 VITALS — BP 112/70 | HR 90 | Ht 69.0 in | Wt 153.0 lb

## 2014-09-06 DIAGNOSIS — F17210 Nicotine dependence, cigarettes, uncomplicated: Secondary | ICD-10-CM

## 2014-09-06 DIAGNOSIS — J449 Chronic obstructive pulmonary disease, unspecified: Secondary | ICD-10-CM

## 2014-09-06 MED ORDER — LEVALBUTEROL TARTRATE 45 MCG/ACT IN AERO: INHALATION_SPRAY | RESPIRATORY_TRACT | Status: DC

## 2014-09-06 NOTE — Progress Notes (Signed)
Quick Note:  Called and spoke with pt. Reviewed results and recs. Pt voiced understanding and had no further questions. ______ 

## 2014-09-06 NOTE — Patient Instructions (Addendum)
symbicort 160 Take 2 puffs first thing in am and then another 2 puffs about 12 hours later.   Only use your levoalbuterol as a rescue medication to be used if you can't catch your breath by resting or doing a relaxed purse lip breathing pattern.  - The less you use it, the better it will work when you need it. - Ok to use up to 2 puffs  every 4 hours if you must but call for immediate appointment if use goes up over your usual need - Don't leave home without it !!  (think of it like the spare tire for your car)   The key is to stop smoking completely before smoking completely stops you!   Please remember to go to the   x-ray department downstairs for your tests - we will call you with the results when they are available.  Please schedule a follow up office visit in 6 weeks, call sooner if needed - late add needs pfts on return

## 2014-09-06 NOTE — Progress Notes (Signed)
Subjective:    Patient ID: Ray Green, male    DOB: May 16, 1950 .   MRN: [id number]    Brief patient profile:  83 yowm active smoker referred by Dr Jonni Sanger to pulmonary clinic 11/19/13 to eval ? chronic obstructive lung disease and asthmatic bronchitic component.     History of Present Illness  11/19/2013 1st  office visit/ Ray Green  / referred by Dr [name] / did not bring meds as requested "I have a list at home" Chief Complaint  Patient presents with  . Pulmonary Consult    Former pt of Dr [name]. Pt states has no co's today.   pt still smoking and insists he has no cough or sob but demonstrated a harsh barking quality dry cough at ov and using symbicort/xopenex both as maint with poor hfa (Green a/p) still able to get around but avoids exertion "because of my back"  rec Plan A = automatic = Symbicort 160 Take 2 puffs first thing in am and then another 2 puffs about 12 hours later.  Plan B= backup Only use your levoalbuterol as a rescue medication   Work on inhaler technique  I will let Dr August Saucer know I very strongly you get off the lisinopril> pt d/c'd ? When (pt not sure)    12/03/2013 f/u ov/Ray Green WU:JWJX with  doe x mailbox and back ("maybe a hundred feet")/ still smokig  Also reports can walk around apt x 15 m s stopping  Still confused on how to use saba and symbicort / reviewed also with daughter rec Make sure you take your prilosec Take 30-60 min before first meal of the day  Plan A = automatic = Symbicort 160 Take 2 puffs first thing in am and then another 2 puffs about 12 hours later.  Plan B= backup Only use your levoalbuterol as a rescue medication  Plan C= contingency = use the nebulizer with albuterol up to every 4 hours if the levoalbuterol fails to relieve your breathing  Work on inhaler technique: Please schedule a follow up office visit in 6 weeks, call sooner if needed with pfts> did not return    09/06/2014 f/u ov/Ray Green re: copd/ still smoking/ losing wt   Chief Complaint  Patient presents with  . Follow-up    Pt here for symbicort refill. He denies any co's today.   he continues to be thoroughly confused about how to use his inhalers both in terms of HFA technique and also timing. Apparently is using both the Symbicort and the Xopenex at the same time twice daily despite instructions to the contrary  that were given to him both verbally and in writing on his last ov. Doe = no change MMRC 1/2    No obvious day to day or daytime variabilty or assoc excess / purulent sputum   cp or chest tightness, subjective wheeze overt sinus or hb symptoms. No unusual exp hx or h/o childhood pna/ asthma or knowledge of premature birth.  Sleeping ok without nocturnal  or early am exacerbation  of respiratory  c/o's or need for noct saba. Also denies any obvious fluctuation of symptoms with weather or environmental changes or other aggravating or alleviating factors except as outlined above   Current Medications, Allergies, Complete Past Medical History, Past Surgical History, Family History, and Social History were reviewed in [employer] [facility] record.  ROS  The following are not active complaints unless bolded sore throat, dysphagia, dental problems, itching, sneezing,  nasal congestion or excess/  purulent secretions, ear ache,   fever, chills, sweats, unintended wt loss, pleuritic or exertional cp, hemoptysis,  orthopnea pnd or leg swelling, presyncope, palpitations, heartburn, abdominal pain, anorexia, nausea, vomiting, diarrhea  or change in bowel or urinary habits, change in stools or urine, dysuria,hematuria,  rash, arthralgias, visual complaints, headache, numbness weakness or ataxia or problems with walking or coordination,  change in mood/affect or memory.         PEx  Cantankerous in a pleasant way   / fully ambulatory nad    12/03/2013      162  > 09/06/2014  153  Wt Readings from Last 3 Encounters:  11/19/13 158 lb (71.668 kg)   10/03/13 161 lb 12.8 oz (73.392 kg)  10/03/12 168 lb (76.204 kg)    Vital signs reviewed  HEENT: full dentures/ nl turbinates, and orophanx. Nl external ear canals without cough reflex   NECK :  without JVD/Nodes/TM/ nl carotid upstrokes bilaterally   LUNGS: no acc muscle use, insp and exp rhonchi bilaterally / slt barrel chest   CV:  RRR  no s3 or murmur or increase in P2, no edema   ABD:  soft and nontender with nl excursion in the supine position. No bruits or organomegaly, bowel sounds nl  MS:  warm without deformities, calf tenderness, cyanosis or clubbing  SKIN: warm and dry without lesions    NEURO:  alert, approp, no deficits      CXR PA and Lateral:   09/06/2014 :     I personally reviewed images and agree with radiology impression as follows:    Stable findings of emphysema without focal acute finding.  Stable lower thoracic compression deformities           Assessment & Plan:

## 2014-09-07 ENCOUNTER — Encounter: Payer: Self-pay | Admitting: Internal Medicine

## 2014-09-07 NOTE — Assessment & Plan Note (Addendum)
>   3 min discussion I reviewed the Fletcher curve with the patient that basically indicates  if you quit smoking when your best day FEV1 is still well preserved (as is relatively the case here but needs pfts to be sure)  it is highly unlikely you will progress to severe disease and informed the patient there was no medication on the market that has proven to alter the curve/ its downward trajectory  or the likelihood of progression of their disease.  Therefore stopping smoking and maintaining abstinence is the most important aspect of care, not choice of inhalers or for that matter, doctors.  Needs to work harder on commit to quit and return for pfts this time since failed to do so last ov

## 2014-09-07 NOTE — Assessment & Plan Note (Addendum)
-    11/20/14 trial off acei strongly rec > d/c'd by ov 12/03/2013   - 12/03/2013  Walked RA x 3 laps @ 185 ft each stopped due to  End of study, nl pace, minimal sob s desat    He has moderate dz clinically and I think he minimizes his symptoms by avoiding strenuous activities.   DDX of  difficult airways management all start with A and  include Adherence, Ace Inhibitors, Acid Reflux, Active Sinus Disease, Alpha 1 Antitripsin deficiency, Anxiety masquerading as Airways dz,  ABPA,  allergy(esp in young), Aspiration (esp in elderly), Adverse effects of meds,  Active smokers, A bunch of PE's (a small clot burden can't cause this syndrome unless there is already severe underlying pulm or vascular dz with poor reserve) plus two Bs  = Bronchiectasis and Beta blocker use..and one C= CHF   Adherence is always the initial "prime suspect" and is a multilayered concern that requires a "trust but verify" approach in every patient - starting with knowing how to use medications, especially inhalers, correctly, keeping up with refills and understanding the fundamental difference between maintenance and prns vs those medications only taken for a very short course and then stopped and not refilled.  - over using saba -The proper method of use, as well as anticipated side effects, of a metered-dose inhaler are discussed and demonstrated to the patient. Improved effectiveness after extensive coaching during this visit to a level of approximately  90% from a baseline of 50%   Active smoking > see sep a/p    I had an extended discussion with the patient reviewing all relevant studies completed to date and  lasting 15 to 20 minutes of a 25 minute visit    Each maintenance medication was reviewed in detail including most importantly the difference between maintenance and prns and under what circumstances the prns are to be triggered using an action plan format that is not reflected in the computer generated alphabetically  organized AVS.    Please see instructions for details which were reviewed in writing and the patient given a copy highlighting the part that I personally wrote and discussed at today's ov.

## 2014-09-16 ENCOUNTER — Other Ambulatory Visit: Payer: Self-pay | Admitting: Internal Medicine

## 2014-09-16 ENCOUNTER — Telehealth: Payer: Self-pay | Admitting: Internal Medicine

## 2014-09-16 MED ORDER — LEVALBUTEROL TARTRATE 45 MCG/ACT IN AERO: INHALATION_SPRAY | RESPIRATORY_TRACT | Status: DC

## 2014-09-16 NOTE — Telephone Encounter (Signed)
Left message for pt to call back  °

## 2014-09-16 NOTE — Telephone Encounter (Signed)
Called pt back Pt stated that he needs refill on xopenex inhaler called into his pharm Pt stated that refill was suppose to be done at last OV but his pharmacy was stating they never received it Informed pt that i would call pharmacy to see what happened  Called and spoke with pharmacist Pharmacist stated that refill was not received on 09/06/2014 Order was re-sent to pharmacy   Nothing further is needed at this time

## 2014-09-16 NOTE — Telephone Encounter (Signed)
Patient returned call, may be reached at [phone].

## 2014-10-04 ENCOUNTER — Ambulatory Visit: Payer: Medicare Other | Admitting: Neurology

## 2014-10-18 ENCOUNTER — Ambulatory Visit: Payer: Medicare Other | Admitting: Internal Medicine

## 2014-10-25 ENCOUNTER — Other Ambulatory Visit: Payer: Self-pay | Admitting: Nurse Practitioner

## 2014-10-28 ENCOUNTER — Other Ambulatory Visit: Payer: Self-pay | Admitting: Nurse Practitioner

## 2014-12-02 ENCOUNTER — Other Ambulatory Visit: Payer: Self-pay | Admitting: Neurology

## 2014-12-20 ENCOUNTER — Other Ambulatory Visit: Payer: Self-pay | Admitting: Neurology

## 2014-12-23 ENCOUNTER — Other Ambulatory Visit: Payer: Self-pay | Admitting: Neurology

## 2014-12-24 ENCOUNTER — Other Ambulatory Visit: Payer: Self-pay

## 2015-01-09 ENCOUNTER — Other Ambulatory Visit: Payer: Self-pay | Admitting: Neurology

## 2015-01-10 NOTE — Telephone Encounter (Signed)
Patient called for dilantin refill. Is overdue for appt. Patient was advised to call first thing Monday for appt. Will bridge dilantin Rx. Sent to pharmacy, patient voiced understanding and agreement.

## 2015-01-13 ENCOUNTER — Telehealth: Payer: Self-pay | Admitting: Neurology

## 2015-01-15 NOTE — Telephone Encounter (Signed)
error 

## 2015-01-27 ENCOUNTER — Other Ambulatory Visit: Payer: Self-pay

## 2015-01-27 MED ORDER — PHENYTOIN SODIUM EXTENDED 100 MG PO CAPS: ORAL_CAPSULE | ORAL | Status: DC

## 2015-01-28 ENCOUNTER — Ambulatory Visit: Payer: Medicare Other | Admitting: Nurse Practitioner

## 2015-01-30 ENCOUNTER — Ambulatory Visit (INDEPENDENT_AMBULATORY_CARE_PROVIDER_SITE_OTHER): Payer: Medicare Other | Admitting: Nurse Practitioner

## 2015-01-30 ENCOUNTER — Encounter: Payer: Self-pay | Admitting: Nurse Practitioner

## 2015-01-30 VITALS — BP 145/81 | HR 75 | Ht 69.0 in | Wt 171.8 lb

## 2015-01-30 DIAGNOSIS — G40909 Epilepsy, unspecified, not intractable, without status epilepticus: Secondary | ICD-10-CM

## 2015-01-30 DIAGNOSIS — Z5181 Encounter for therapeutic drug level monitoring: Secondary | ICD-10-CM

## 2015-01-30 NOTE — Progress Notes (Signed)
[facility]  PATIENT: Ray Green DOB: 03/02/1950   REASON FOR VISIT: Follow-up for seizure disorder HISTORY FROM: Patient    HISTORY OF PRESENT ILLNESS:Mr. Hermann, 65 year old right-handed white male with a history of seizures returns for followup. He is with his aide at todays visit.  He was last seen in the office 10/03/2013 .The patient has done well since last seen without recurrence of seizures. The patient has not had a seizure in over 10 years. The patient is on Dilantin, and is tolerating the medication very well. The patient does have a history of osteoporosis, but he has stopped taking Fosamax, and he is no longer on calcium and vitamin D supplementation because they caused constipation. The patient has not had any recent falls. The patient has chronic back pain currently on morphine.He returns for reevaluation. He denies any problems with balance, he has not had any falls.  REVIEW OF SYSTEMS: Full 14 system review of systems performed and notable only for those listed, all others are neg:  Constitutional: neg  Cardiovascular: neg Ear/Nose/Throat: neg  Skin: neg Eyes: neg Respiratory: neg Gastroitestinal: neg  Hematology/Lymphatic: neg  Endocrine: neg Musculoskeletal:neg Allergy/Immunology: neg Neurological: neg Psychiatric: neg Sleep : neg   ALLERGIES: No Known Allergies  HOME MEDICATIONS: Outpatient Prescriptions Prior to Visit  Medication Sig Dispense Refill  . albuterol (PROVENTIL) (2.5 MG/3ML) 0.083% nebulizer solution inhale contents of 1 vial in nebulizer every 6 hours if needed for wheezing 120 mL 1  . clopidogrel (PLAVIX) 75 MG tablet Take 75 mg by mouth daily.      . diazepam (VALIUM) 5 MG tablet Take 5 mg by mouth every 12 (twelve) hours as needed.     . furosemide (LASIX) 40 MG tablet Take 40 mg by mouth daily.     [name] levalbuterol (XOPENEX HFA) 45 MCG/ACT inhaler inhale 2 puffs every 6 hours if needed for wheezing 15 g 11  .  omeprazole (PRILOSEC) 20 MG capsule take 1 capsule by mouth once daily 30 capsule 6  . phenytoin (DILANTIN) 100 MG ER capsule Take 5 caps by mouth once daily . MUST COME TO APPT BEFORE FURTHER REFILLS. 75 capsule 0  . pregabalin (LYRICA) 75 MG capsule Take 75 mg by mouth every 8 (eight) hours.      [name] rOPINIRole (REQUIP) 4 MG tablet Take 4 mg by mouth at bedtime.      . SYMBICORT 160-4.5 MCG/ACT inhaler inhale 2 puffs by mouth twice a day 10.2 g 6  . levalbuterol (XOPENEX HFA) 45 MCG/ACT inhaler inhale 2 puffs every 6 hours if needed for wheezing 1 Inhaler 2  . morphine (MSIR) 15 MG tablet Take 15 mg by mouth 3 (three) times daily as needed for moderate pain (take along with  MSIR).     [name] morphine (MSIR) 30 MG tablet Take 30 mg by mouth 3 (three) times daily as needed for moderate pain (take along with  MSIR).     . Vitamin D, Ergocalciferol, (DRISDOL) 50000 UNITS CAPS capsule Take 50,000 Units by mouth 2 (two) times a week.     No facility-administered medications prior to visit.    PAST MEDICAL HISTORY: Past Medical History  Diagnosis Date  . Hyponatremia   . Anemia   . Incontinence, feces   . Lumbar back pain   . Alcohol abuse   . HH (hiatus hernia)   . Chronic bronchitis   . TIA (transient ischemic attack)   . Osteoporosis   .  Seizure disorder (HCC)   . Hypertension   . GERD (gastroesophageal reflux disease)   . COPD (chronic obstructive pulmonary disease) (HCC)   . DDD (degenerative disc disease), cervical   . RLS (restless legs syndrome)   . Seizures (HCC)     PAST SURGICAL HISTORY: Past Surgical History  Procedure Laterality Date  . Rotator cuff repair      left  . Lumbar laminectomy/decompression microdiscectomy    . Inguinal hernia repair  02/28/95    rt    FAMILY HISTORY: Family History  Problem Relation Age of Onset  . Bone cancer Mother   . Coronary artery disease      SOCIAL HISTORY: Social History   Social History  . Marital Status: Divorced      Spouse Name: N/A  . Number of Children: 1  . Years of Education: 12th   Occupational History  . disabled    Social History Main Topics  . Smoking status: Current Every Day Smoker -- 1.00 packs/day    Types: Cigarettes  . Smokeless tobacco: Never Used  . Alcohol Use: No     Comment: pt quit 5 years ago.   . Drug Use: No  . Sexual Activity: Not on file   Other Topics Concern  . Not on file   Social History Narrative   Patient is divorced and has 1 child.    Patient is single.    Patient has a high school education.      PHYSICAL EXAM  Filed Vitals:   01/30/15 1519  BP: 145/81  Pulse: 75  Height:  (1.753 m)  Weight: 171 lb 12.8 oz (77.928 kg)   Body mass index is 25.36 kg/(m^2). Generalized: Well developed, appears older than stated age in no acute distress  Musculoskeletal: No deformity  Neurological examination  Mentation: Alert oriented to time, place, history taking. Follows all commands speech and language fluent  Cranial nerve II-XII: Pupils were equal round reactive to light extraocular movements were full, visual field were full on confrontational test. Facial sensation and strength were normal. hearing was intact to finger rubbing bilaterally. Uvula tongue midline. head turning and shoulder shrug and were normal and symmetric.Tongue protrusion into cheek strength was normal.  Motor: normal bulk and tone, full strength in the BUE, BLE, fine finger movements normal, no pronator drift. No focal weakness  Coordination: finger-nose-finger, heel-to-shin bilaterally, no dysmetria  Reflexes: 1+ upper and lower and symmetric  Gait and Station: Rising up from seated position without assistance, normal stance, moderate stride, good arm swing, smooth turning, able to perform tiptoe, and heel walking without difficulty. Tandem gait is unsteady. No assistive device  DIAGNOSTIC DATA (LABS, IMAGING, TESTING)  ASSESSMENT AND PLAN  65 y.o. year old with   Seizures (HCC). here to follow-up.   Continue Dilantin at current dose will await labs to refill Check labs today, CBC, CMP and Dilantin Call for any seizure activity Followup yearly and when necessary Nilda Riggs, [facility], [facility], APRN  [facility] [address], [address] [place], [place] [address] [phone]

## 2015-01-30 NOTE — Progress Notes (Signed)
I have read the note, and I agree with the clinical assessment and plan.  Ilma Achee KEITH   

## 2015-01-30 NOTE — Patient Instructions (Signed)
Continue Dilantin at current dose Check labs today, CBC, CMP and Dilantin Call for any seizure activity Followup yearly and when necessary

## 2015-01-31 ENCOUNTER — Other Ambulatory Visit: Payer: Self-pay | Admitting: Nurse Practitioner

## 2015-01-31 ENCOUNTER — Telehealth: Payer: Self-pay | Admitting: *Deleted

## 2015-01-31 LAB — COMPREHENSIVE METABOLIC PANEL
A/G RATIO: 1.4 (ref 1.1–2.5)
ALT: 10 IU/L (ref 0–44)
AST: 17 IU/L (ref 0–40)
Albumin: 4 g/dL (ref 3.6–4.8)
Alkaline Phosphatase: 124 IU/L — ABNORMAL HIGH (ref 39–117)
BILIRUBIN TOTAL: 0.2 mg/dL (ref 0.0–1.2)
BUN/Creatinine Ratio: 12 (ref 10–22)
BUN: 9 mg/dL (ref 8–27)
CALCIUM: 9.1 mg/dL (ref 8.6–10.2)
CHLORIDE: 94 mmol/L — AB (ref 96–106)
CO2: 23 mmol/L (ref 18–29)
Creatinine, Ser: 0.73 mg/dL — ABNORMAL LOW (ref 0.76–1.27)
GFR, EST AFRICAN AMERICAN: 113 mL/min/{1.73_m2} (ref >59)
GFR, EST NON AFRICAN AMERICAN: 97 mL/min/{1.73_m2} (ref >59)
GLOBULIN, TOTAL: 2.8 g/dL (ref 1.5–4.5)
Glucose: 96 mg/dL (ref 65–99)
POTASSIUM: 4.4 mmol/L (ref 3.5–5.2)
SODIUM: 135 mmol/L (ref 134–144)
TOTAL PROTEIN: 6.8 g/dL (ref 6.0–8.5)

## 2015-01-31 LAB — CBC WITH DIFFERENTIAL/PLATELET
BASOS: 0 %
Basophils Absolute: 0 10*3/uL (ref 0.0–0.2)
EOS (ABSOLUTE): 0.2 10*3/uL (ref 0.0–0.4)
Eos: 2 %
Hematocrit: 40 % (ref 37.5–51.0)
Hemoglobin: 13.6 g/dL (ref 12.6–17.7)
IMMATURE GRANS (ABS): 0 10*3/uL (ref 0.0–0.1)
Immature Granulocytes: 0 %
LYMPHS ABS: 1.6 10*3/uL (ref 0.7–3.1)
Lymphs: 20 %
MCH: 30.7 pg (ref 26.6–33.0)
MCHC: 34 g/dL (ref 31.5–35.7)
MCV: 90 fL (ref 79–97)
Monocytes Absolute: 1.2 10*3/uL — ABNORMAL HIGH (ref 0.1–0.9)
Monocytes: 14 %
Neutrophils Absolute: 5.2 10*3/uL (ref 1.4–7.0)
Neutrophils: 64 %
Platelets: 308 10*3/uL (ref 150–379)
RBC: 4.43 x10E6/uL (ref 4.14–5.80)
RDW: 13.4 % (ref 12.3–15.4)
WBC: 8.3 10*3/uL (ref 3.4–10.8)

## 2015-01-31 LAB — PHENYTOIN LEVEL, TOTAL: Phenytoin (Dilantin), Serum: 14.1 ug/mL (ref 10.0–20.0)

## 2015-01-31 MED ORDER — PHENYTOIN SODIUM EXTENDED 100 MG PO CAPS: ORAL_CAPSULE | ORAL | Status: AC

## 2015-01-31 NOTE — Telephone Encounter (Signed)
Spoke to pt and relayed that lab results looked good.  He stated ok.

## 2015-01-31 NOTE — Telephone Encounter (Signed)
-----   Message from Nilda Riggs, NP sent at 01/31/2015  8:52 AM EST ----- Labs good please call patient

## 2015-02-28 ENCOUNTER — Other Ambulatory Visit: Payer: Self-pay | Admitting: Internal Medicine

## 2015-03-04 ENCOUNTER — Other Ambulatory Visit: Payer: Self-pay | Admitting: Internal Medicine

## 2015-03-07 ENCOUNTER — Telehealth: Payer: Self-pay | Admitting: Internal Medicine

## 2015-03-07 MED ORDER — BUDESONIDE-FORMOTEROL FUMARATE 160-4.5 MCG/ACT IN AERO: 2.0000 | INHALATION_SPRAY | Freq: Two times a day (BID) | RESPIRATORY_TRACT | Status: DC

## 2015-03-07 MED ORDER — LEVALBUTEROL TARTRATE 45 MCG/ACT IN AERO: INHALATION_SPRAY | RESPIRATORY_TRACT | Status: DC

## 2015-03-07 NOTE — Telephone Encounter (Signed)
Per 09/06/14 OV: Patient Instructions       symbicort 160 Take 2 puffs first thing in am and then another 2 puffs about 12 hours later.   Only use your levoalbuterol as a rescue medication to be used if you can't catch your breath by resting or doing a relaxed purse lip breathing pattern.   - The less you use it, the better it will work when you need it. - Ok to use up to 2 puffs  every 4 hours if you must but call for immediate appointment if use goes up over your usual need - Don't leave home without it !!  (think of it like the spare tire for your car)   The key is to stop smoking completely before smoking completely stops you!   Please remember to go to the   x-ray department downstairs for your tests - we will call you with the results when they are available.  Please schedule a follow up office visit in 6 weeks, call sooner if needed - late add needs pfts on return    ---   Called spoke with pt. He needs refills on symbicort/xopenex inhaler.  He is scheduled to see MW 04/07/15. Pt refused to schedule PFT appt. Nothing further needed

## 2015-04-07 ENCOUNTER — Encounter: Payer: Self-pay | Admitting: Internal Medicine

## 2015-04-07 ENCOUNTER — Ambulatory Visit (INDEPENDENT_AMBULATORY_CARE_PROVIDER_SITE_OTHER): Payer: Medicare Other | Admitting: Internal Medicine

## 2015-04-07 VITALS — BP 132/68 | HR 158 | Wt 171.0 lb

## 2015-04-07 DIAGNOSIS — J449 Chronic obstructive pulmonary disease, unspecified: Secondary | ICD-10-CM

## 2015-04-07 DIAGNOSIS — F17210 Nicotine dependence, cigarettes, uncomplicated: Secondary | ICD-10-CM

## 2015-04-07 DIAGNOSIS — Z720 Tobacco use: Secondary | ICD-10-CM | POA: Diagnosis not present

## 2015-04-07 MED ORDER — LEVALBUTEROL TARTRATE 45 MCG/ACT IN AERO: INHALATION_SPRAY | RESPIRATORY_TRACT | Status: AC

## 2015-04-07 MED ORDER — BUDESONIDE-FORMOTEROL FUMARATE 160-4.5 MCG/ACT IN AERO: 2.0000 | INHALATION_SPRAY | Freq: Two times a day (BID) | RESPIRATORY_TRACT | Status: AC

## 2015-04-07 NOTE — Patient Instructions (Addendum)
Plan A = Automatic = Symbicort 160 Take 2 puffs first thing in am and then another 2 puffs about 12 hours later.     Plan B = Backup Only use your levoalbuterol (xopenex( as a rescue medication to be used if you can't catch your breath by resting or doing a relaxed purse lip breathing pattern.  - The less you use it, the better it will work when you need it. - Ok to use up to 2 puffs  every 4 hours if you must but call for appointment if use goes up over your usual need - Don't leave home without it !!  (think of it like the spare tire for your car)   The key is to stop smoking completely before smoking completely stops you!   See your primary doctor about your heart rate and referral to cardiologist of your choice  Please schedule a follow up visit in 3 months but call sooner if needed with pfts on return

## 2015-04-07 NOTE — Assessment & Plan Note (Signed)
>   4555m discussion Discussed the risks and costs (both direct and indirect)  of smoking relative to the benefits of quitting but patient unwilling to commit at this point to a specific quit date.    Although I don't endorse regular use of e cigs/ many pts find them helpful; however, I emphasized they should be considered a "one-way bridge" off all tobacco products.

## 2015-04-07 NOTE — Progress Notes (Signed)
Subjective:    Patient ID: Ray Green, male    DOB: January 31, 1950 .   MRN: [id number]    Brief patient profile:  83 yowm active smoker referred by Dr Jonni Sanger to pulmonary clinic 11/19/13 to eval ? chronic obstructive lung disease and asthmatic bronchitic component.     History of Present Illness  11/19/2013 1st  office visit/ Ray Green  / referred by Dr [name] / did not bring meds as requested "I have a list at home" Chief Complaint  Patient presents with  . Pulmonary Consult    Former pt of Dr [name]. Pt states has no co's today.   pt still smoking and insists he has no cough or sob but demonstrated a harsh barking quality dry cough at ov and using symbicort/xopenex both as maint with poor hfa (Green a/p) still able to get around but avoids exertion "because of my back"  rec Plan A = automatic = Symbicort 160 Take 2 puffs first thing in am and then another 2 puffs about 12 hours later.  Plan B= backup Only use your levoalbuterol as a rescue medication   Work on inhaler technique  I will let Dr August Saucer know I very strongly you get off the lisinopril> pt d/c'd ? When (pt not sure)    12/03/2013 f/u ov/Ray Green [id number] with  doe x mailbox and back ("maybe a hundred feet")/ still smokig  Also reports can walk around apt x 15 m s stopping  Still confused on how to use saba and symbicort / reviewed also with daughter rec Make sure you take your prilosec Take 30-60 min before first meal of the day  Plan A = automatic = Symbicort 160 Take 2 puffs first thing in am and then another 2 puffs about 12 hours later.  Plan B= backup Only use your levoalbuterol as a rescue medication  Plan C= contingency = use the nebulizer with albuterol up to every 4 hours if the levoalbuterol fails to relieve your breathing  Work on inhaler technique: Please schedule a follow up office visit in 6 weeks, call sooner if needed with pfts> did not return    09/06/2014 f/u ov/Ray Green re: copd/ still smoking/ losing wt   Chief Complaint  Patient presents with  . Follow-up    Pt here for symbicort refill. He denies any co's today.   he continues to be thoroughly confused about how to use his inhalers both in terms of HFA technique and also timing. Apparently is using both the Symbicort and the Xopenex at the same time twice daily despite instructions to the contrary  that were given to him both verbally and in writing on his last ov. Doe = no change MMRC 1/2  rec symbicort 160 Take 2 puffs first thing in am and then another 2 puffs about 12 hours later.  Only use your levoalbuterol as a rescue medication The key is to stop smoking completely before smoking completely stops you!    04/07/2015  f/u ov/Ray Green re: suspected copd/ab on symb and xopenex bid/ still smoking  Chief Complaint  Patient presents with  . Follow-up    Breathing is unchanged. No new co's today.   Doe = MMRC2 = can't walk a nl pace on a flat grade s sob/ does ok slow on flat grade    No obvious day to day or daytime variabilty or assoc excess / purulent sputum   cp or chest tightness, subjective wheeze overt sinus or hb symptoms. No unusual exp  hx or h/o childhood pna/ asthma or knowledge of premature birth.  Sleeping ok without nocturnal  or early am exacerbation  of respiratory  c/o's or need for noct saba. Also denies any obvious fluctuation of symptoms with weather or environmental changes or other aggravating or alleviating factors except as outlined above   Current Medications, Allergies, Complete Past Medical History, Past Surgical History, Family History, and Social History were reviewed in [facility] electronic medical record.  ROS  The following are not active complaints unless bolded sore throat, dysphagia, dental problems, itching, sneezing,  nasal congestion or excess/ purulent secretions, ear ache,   fever, chills, sweats, unintended wt loss, pleuritic or exertional cp, hemoptysis,  orthopnea pnd or leg swelling,  presyncope, palpitations, heartburn, abdominal pain, anorexia, nausea, vomiting, diarrhea  or change in bowel or urinary habits, change in stools or urine, dysuria,hematuria,  rash, arthralgias, visual complaints, headache, numbness weakness or ataxia or problems with walking or coordination,  change in mood/affect or memory.         PEx  Cantankerous / agitated today  / fully ambulatory nad    12/03/2013      162  > 09/06/2014  153 >  04/07/2015  171   Wt Readings from Last 3 Encounters:  11/19/13 158 lb (71.668 kg)  10/03/13 161 lb 12.8 oz (73.392 kg)  10/03/12 168 lb (76.204 kg)    Vital signs reviewed/ note pulse   HEENT: full dentures/ nl turbinates, and orophanx. Nl external ear canals without cough reflex   NECK :  without JVD/Nodes/TM/ nl carotid upstrokes bilaterally   LUNGS: no acc muscle use, insp and exp rhonchi bilaterally / slt barrel chest   CV:  IRIR up to 160  no s3 or murmur or increase in P2, no edema   ABD:  soft and nontender with nl excursion in the supine position. No bruits or organomegaly, bowel sounds nl  MS:  warm without deformities, calf tenderness, cyanosis or clubbing  SKIN: warm and dry without lesions    NEURO:  alert, approp, no deficits      CXR PA and Lateral:   09/06/2014 :     I personally reviewed images and agree with radiology impression as follows:    Stable findings of emphysema without focal acute finding. Stable lower thoracic compression deformities       ekg 04/07/2015 refused "I have to go pay bills today"     Assessment & Plan:

## 2015-04-07 NOTE — Assessment & Plan Note (Addendum)
-    11/20/14 trial off acei strongly rec > d/c'd by ov 12/03/2013  -  12/03/2013  Walked RA x 3 laps @ 185 ft each stopped due to  End of study, nl pace, minimal sob s desat -  09/06/2014 p extensive coaching HFA effectiveness =    90%   DDX of  difficult airways management almost all start with A and  include Adherence, Ace Inhibitors, Acid Reflux, Active Sinus Disease, Alpha 1 Antitripsin deficiency, Anxiety masquerading as Airways dz,  ABPA,  Allergy(esp in young), Aspiration (esp in elderly), Adverse effects of meds,  Active smokers, A bunch of PE's (a small clot burden can't cause this syndrome unless there is already severe underlying pulm or vascular dz with poor reserve) plus two Bs  = Bronchiectasis and Beta blocker use..and one C= CHF  Adherence is always the initial "prime suspect" and is a multilayered concern that requires a "trust but verify" approach in every patient - starting with knowing how to use medications, especially inhalers, correctly, keeping up with refills and understanding the fundamental difference between maintenance and prns vs those medications only taken for a very short course and then stopped and not refilled.  - confused between maint symbicort 160 and prn xopenex (despite prev instructions provided in writing) / reviewed  Active smoking (see separate a/p)   ? Anxiety > usually at the bottom of this list of usual suspects but should be much higher on this pt's based on H and P and note already on psychotropics .   ? Chf/ probably in PAF but asymptomatic/ refuses w/u > Follow up per Primary Care recommended   Pulmonary in 3 m with pfts or at least spirometry and if refuses this also will release him from regular f/u at this office and just see prn resp symptoms

## 2015-04-14 ENCOUNTER — Telehealth: Payer: Self-pay | Admitting: Internal Medicine

## 2015-04-14 DIAGNOSIS — R000 Tachycardia, unspecified: Secondary | ICD-10-CM

## 2015-04-14 NOTE — Telephone Encounter (Signed)
Spoke with the pt  He states that he thought MW wanted to increase his meds at last visit  He could not recall the name of the med in question He then started talking about his heart rate being elevated and that he is unable to see his PCP until mid June  He wonders if we can do the referral to cards  Please advise, thanks  Plan A = Automatic = Symbicort 160 Take 2 puffs first thing in am and then another 2 puffs about 12 hours later.     Plan B = Backup Only use your levoalbuterol (xopenex( as a rescue medication to be used if you can't catch your breath by resting or doing a relaxed purse lip breathing pattern.  - The less you use it, the better it will work when you need it. - Ok to use up to 2 puffs every 4 hours if you must but call for appointment if use goes up over your usual need - Don't leave home without it !! (think of it like the spare tire for your car)   The key is to stop smoking completely before smoking completely stops you!   See your primary doctor about your heart rate and referral to cardiologist of your choice  Please schedule a follow up visit in 3 months but call sooner if needed with pfts on return

## 2015-04-14 NOTE — Telephone Encounter (Signed)
lmtcb x1 for pt. 

## 2015-04-14 NOTE — Telephone Encounter (Signed)
Easily confused with instructions but my recs are reflected on his AVS correctly  Ok to refer to Cards here for Rapid pulse (had refused ekg at last ov)

## 2015-04-15 NOTE — Telephone Encounter (Signed)
lmtcb x2 for pt. 

## 2015-04-15 NOTE — Telephone Encounter (Signed)
LVM for pt to return call

## 2015-04-16 NOTE — Telephone Encounter (Signed)
Called and discussed AVS with patient. Patient verbalized understanding. Patient states that he could not get appointment with his PCP until June.  Asked patient if he would like for us to put in the referral for Cardiology so he can be seen sooner.  He said yes, so I put in order for Referral to Cardiology. Patient aware. Nothing further needed.

## 2015-04-22 IMAGING — CR DG CHEST 2V
2 series · 2 of 2 positions shown · non-contrast
Comparison: Multiple priors dating back to [id number].

CLINICAL DATA: COPD.  Cigarette smoker.

EXAM:
CHEST  2 VIEW

[view not recorded (1 of 2)]
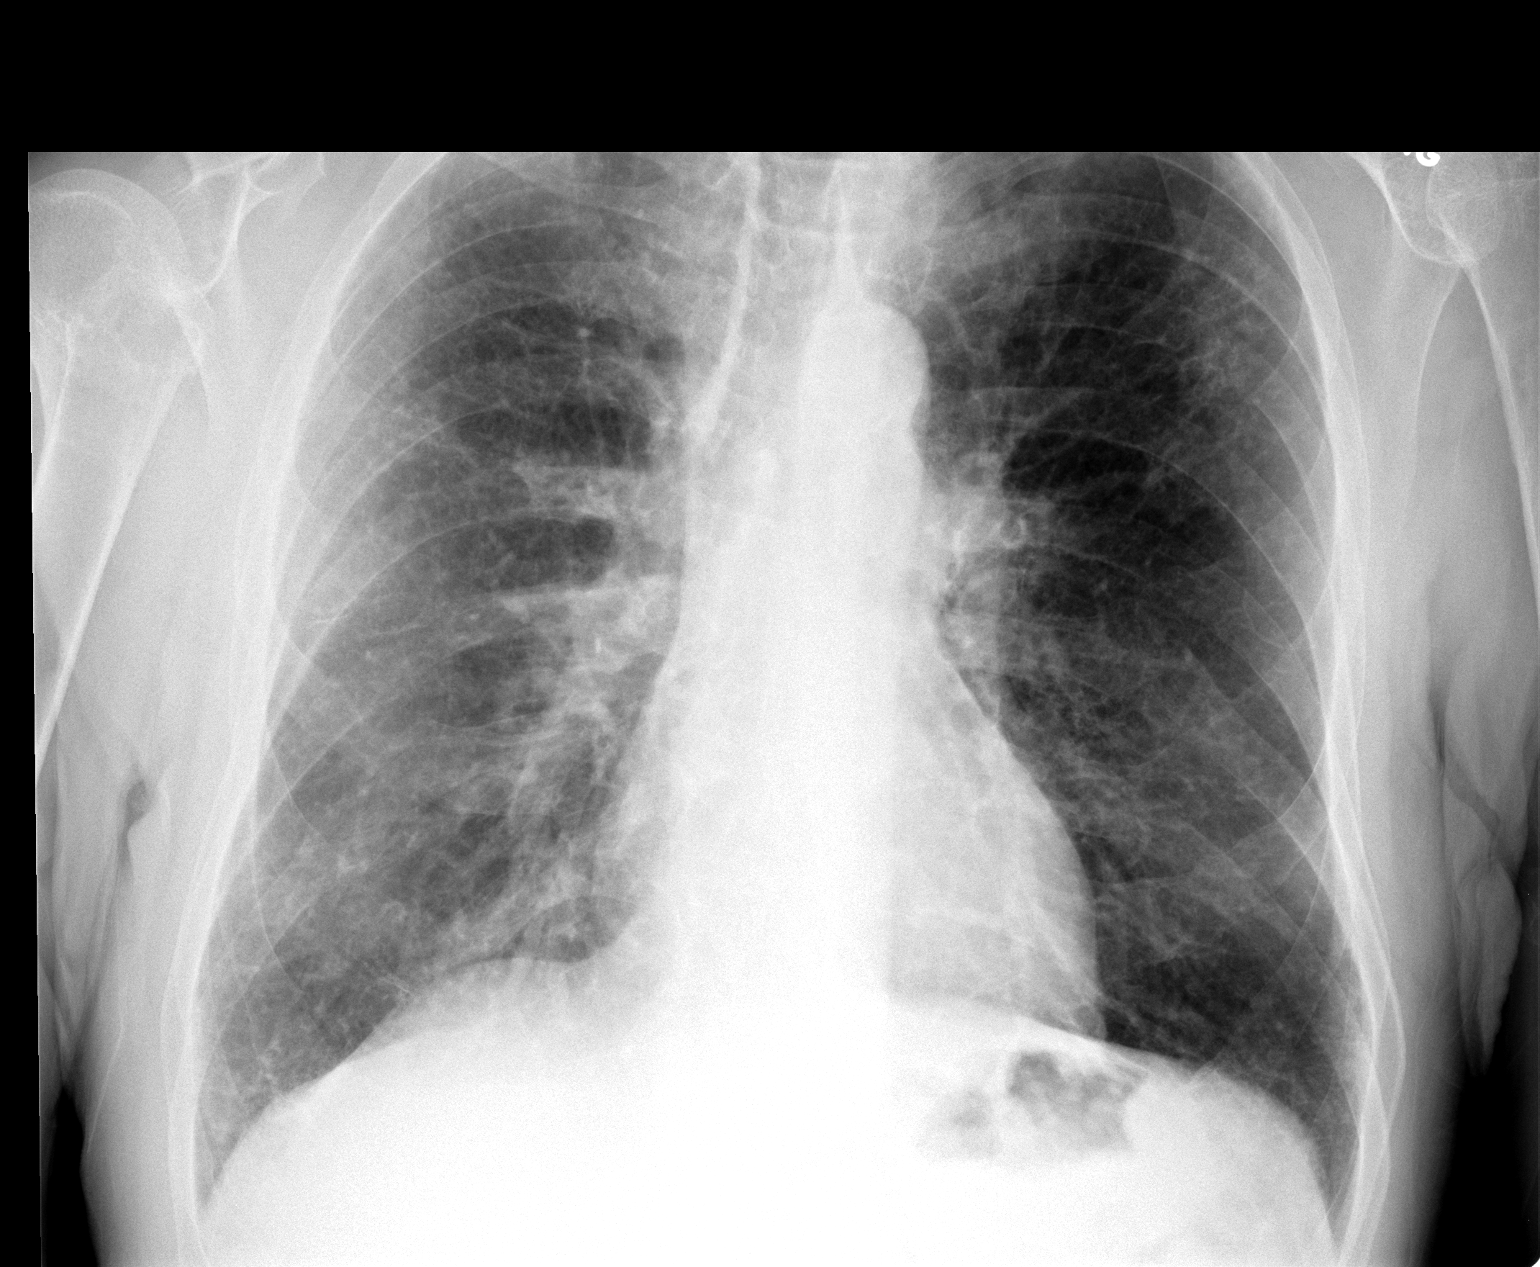

[view not recorded (2 of 2)]
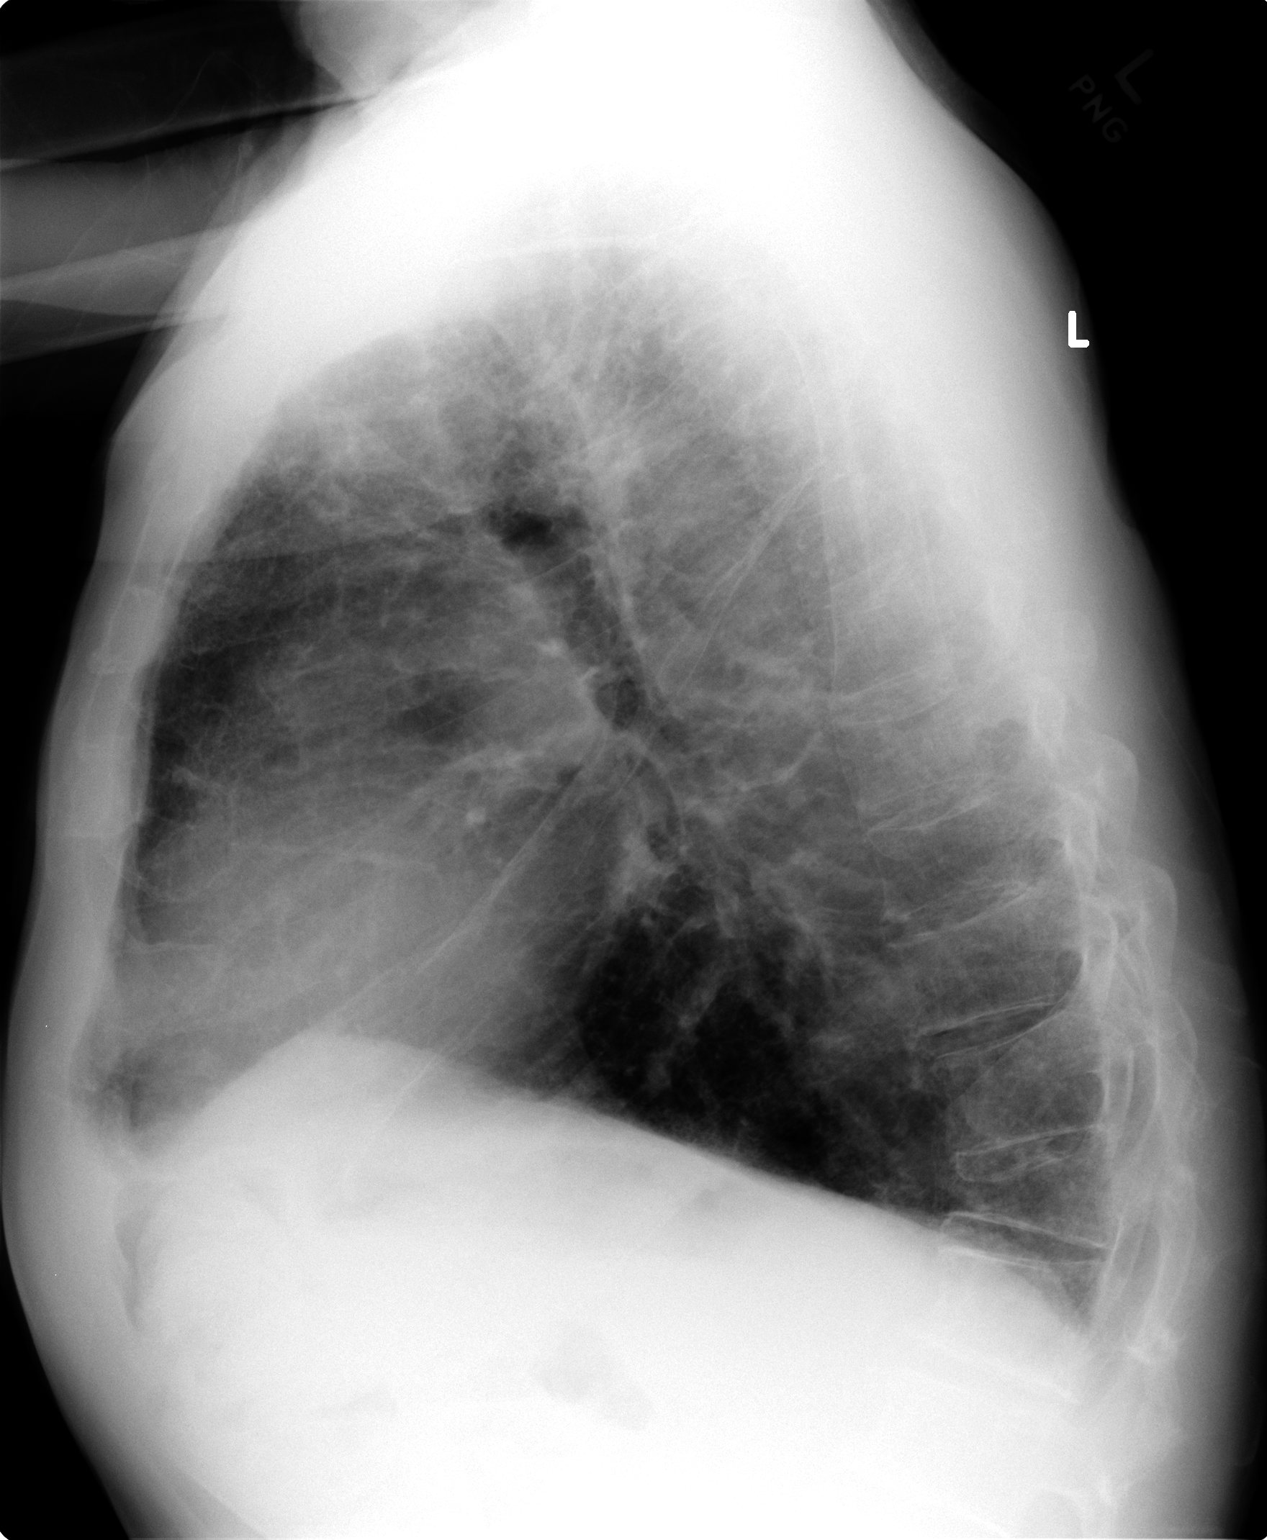

[2 of 2 positions shown; findings below may reference images not displayed]

FINDINGS: The cardiopericardial silhouette is within normal limits. No
airspace consolidation. Emphysema is present. Chronic lower thoracic
compression fracture with thoracolumbar Asymmetric parenchymal
density is present on the frontal view which is favored to be
technical. kyphosis. Mediastinal contours appear similar to prior.
Bilateral pleural apical scarring.
IMPRESSION: Emphysema without acute cardiopulmonary disease.

## 2015-04-30 ENCOUNTER — Encounter: Payer: Self-pay | Admitting: Cardiology

## 2015-05-07 ENCOUNTER — Ambulatory Visit: Payer: Medicare Other | Admitting: Cardiology

## 2015-05-29 ENCOUNTER — Inpatient Hospital Stay (HOSPITAL_COMMUNITY)
Admission: EM | Admit: 2015-05-29 | Discharge: 2015-06-05 | DRG: 871 | Disposition: E | Payer: Medicare Other | Attending: Pulmonary Disease | Admitting: Pulmonary Disease

## 2015-05-29 ENCOUNTER — Encounter (HOSPITAL_COMMUNITY): Payer: Self-pay

## 2015-05-29 ENCOUNTER — Emergency Department (HOSPITAL_COMMUNITY): Payer: Medicare Other

## 2015-05-29 ENCOUNTER — Inpatient Hospital Stay (HOSPITAL_COMMUNITY): Payer: Medicare Other

## 2015-05-29 DIAGNOSIS — Z66 Do not resuscitate: Secondary | ICD-10-CM | POA: Diagnosis present

## 2015-05-29 DIAGNOSIS — Z8249 Family history of ischemic heart disease and other diseases of the circulatory system: Secondary | ICD-10-CM | POA: Diagnosis not present

## 2015-05-29 DIAGNOSIS — R0600 Dyspnea, unspecified: Secondary | ICD-10-CM | POA: Diagnosis not present

## 2015-05-29 DIAGNOSIS — G9340 Encephalopathy, unspecified: Secondary | ICD-10-CM | POA: Diagnosis present

## 2015-05-29 DIAGNOSIS — Z515 Encounter for palliative care: Secondary | ICD-10-CM | POA: Diagnosis present

## 2015-05-29 DIAGNOSIS — G8929 Other chronic pain: Secondary | ICD-10-CM | POA: Diagnosis present

## 2015-05-29 DIAGNOSIS — M5030 Other cervical disc degeneration, unspecified cervical region: Secondary | ICD-10-CM | POA: Diagnosis present

## 2015-05-29 DIAGNOSIS — E871 Hypo-osmolality and hyponatremia: Secondary | ICD-10-CM | POA: Diagnosis present

## 2015-05-29 DIAGNOSIS — I959 Hypotension, unspecified: Secondary | ICD-10-CM | POA: Diagnosis present

## 2015-05-29 DIAGNOSIS — J440 Chronic obstructive pulmonary disease with acute lower respiratory infection: Secondary | ICD-10-CM | POA: Diagnosis present

## 2015-05-29 DIAGNOSIS — I110 Hypertensive heart disease with heart failure: Secondary | ICD-10-CM | POA: Diagnosis present

## 2015-05-29 DIAGNOSIS — R000 Tachycardia, unspecified: Secondary | ICD-10-CM | POA: Diagnosis present

## 2015-05-29 DIAGNOSIS — Z7951 Long term (current) use of inhaled steroids: Secondary | ICD-10-CM

## 2015-05-29 DIAGNOSIS — K449 Diaphragmatic hernia without obstruction or gangrene: Secondary | ICD-10-CM | POA: Diagnosis present

## 2015-05-29 DIAGNOSIS — J9601 Acute respiratory failure with hypoxia: Secondary | ICD-10-CM | POA: Diagnosis not present

## 2015-05-29 DIAGNOSIS — M810 Age-related osteoporosis without current pathological fracture: Secondary | ICD-10-CM | POA: Diagnosis present

## 2015-05-29 DIAGNOSIS — I10 Essential (primary) hypertension: Secondary | ICD-10-CM | POA: Diagnosis present

## 2015-05-29 DIAGNOSIS — I5023 Acute on chronic systolic (congestive) heart failure: Secondary | ICD-10-CM | POA: Diagnosis present

## 2015-05-29 DIAGNOSIS — Z8673 Personal history of transient ischemic attack (TIA), and cerebral infarction without residual deficits: Secondary | ICD-10-CM | POA: Diagnosis not present

## 2015-05-29 DIAGNOSIS — M545 Low back pain: Secondary | ICD-10-CM | POA: Diagnosis present

## 2015-05-29 DIAGNOSIS — G40909 Epilepsy, unspecified, not intractable, without status epilepticus: Secondary | ICD-10-CM | POA: Diagnosis present

## 2015-05-29 DIAGNOSIS — Z452 Encounter for adjustment and management of vascular access device: Secondary | ICD-10-CM | POA: Insufficient documentation

## 2015-05-29 DIAGNOSIS — R4182 Altered mental status, unspecified: Secondary | ICD-10-CM

## 2015-05-29 DIAGNOSIS — Z79899 Other long term (current) drug therapy: Secondary | ICD-10-CM | POA: Diagnosis not present

## 2015-05-29 DIAGNOSIS — J189 Pneumonia, unspecified organism: Secondary | ICD-10-CM | POA: Diagnosis present

## 2015-05-29 DIAGNOSIS — Z808 Family history of malignant neoplasm of other organs or systems: Secondary | ICD-10-CM

## 2015-05-29 DIAGNOSIS — F1010 Alcohol abuse, uncomplicated: Secondary | ICD-10-CM | POA: Diagnosis present

## 2015-05-29 DIAGNOSIS — A419 Sepsis, unspecified organism: Secondary | ICD-10-CM | POA: Diagnosis present

## 2015-05-29 DIAGNOSIS — G2581 Restless legs syndrome: Secondary | ICD-10-CM | POA: Diagnosis present

## 2015-05-29 DIAGNOSIS — R32 Unspecified urinary incontinence: Secondary | ICD-10-CM | POA: Diagnosis present

## 2015-05-29 DIAGNOSIS — D649 Anemia, unspecified: Secondary | ICD-10-CM | POA: Diagnosis present

## 2015-05-29 DIAGNOSIS — Z7902 Long term (current) use of antithrombotics/antiplatelets: Secondary | ICD-10-CM | POA: Diagnosis not present

## 2015-05-29 DIAGNOSIS — J9602 Acute respiratory failure with hypercapnia: Secondary | ICD-10-CM | POA: Diagnosis not present

## 2015-05-29 DIAGNOSIS — F17210 Nicotine dependence, cigarettes, uncomplicated: Secondary | ICD-10-CM | POA: Diagnosis present

## 2015-05-29 DIAGNOSIS — K219 Gastro-esophageal reflux disease without esophagitis: Secondary | ICD-10-CM | POA: Diagnosis present

## 2015-05-29 DIAGNOSIS — I4891 Unspecified atrial fibrillation: Secondary | ICD-10-CM | POA: Insufficient documentation

## 2015-05-29 DIAGNOSIS — E872 Acidosis: Secondary | ICD-10-CM | POA: Diagnosis present

## 2015-05-29 DIAGNOSIS — J948 Other specified pleural conditions: Secondary | ICD-10-CM | POA: Diagnosis not present

## 2015-05-29 DIAGNOSIS — R0603 Acute respiratory distress: Secondary | ICD-10-CM

## 2015-05-29 DIAGNOSIS — J989 Respiratory disorder, unspecified: Secondary | ICD-10-CM

## 2015-05-29 LAB — I-STAT ARTERIAL BLOOD GAS, ED
Acid-base deficit: 1 mmol/L (ref 0.0–2.0)
Bicarbonate: 26.2 mEq/L — ABNORMAL HIGH (ref 20.0–24.0)
O2 SAT: 99 %
PCO2 ART: 50.9 mmHg — AB (ref 35.0–45.0)
PO2 ART: 165 mmHg — AB (ref 80.0–100.0)
Patient temperature: 98.6
TCO2: 28 mmol/L (ref 0–100)
pH, Arterial: 7.32 — ABNORMAL LOW (ref 7.350–7.450)

## 2015-05-29 LAB — CBC WITH DIFFERENTIAL/PLATELET
BASOS PCT: 0 %
Basophils Absolute: 0 10*3/uL (ref 0.0–0.1)
EOS ABS: 0 10*3/uL (ref 0.0–0.7)
Eosinophils Relative: 0 %
HCT: 43.4 % (ref 39.0–52.0)
Hemoglobin: 14.8 g/dL (ref 13.0–17.0)
Lymphocytes Relative: 6 %
Lymphs Abs: 1 10*3/uL (ref 0.7–4.0)
MCH: 29.5 pg (ref 26.0–34.0)
MCHC: 34.1 g/dL (ref 30.0–36.0)
MCV: 86.5 fL (ref 78.0–100.0)
MONO ABS: 1 10*3/uL (ref 0.1–1.0)
Monocytes Relative: 6 %
NEUTROS ABS: 14.5 10*3/uL — AB (ref 1.7–7.7)
NEUTROS PCT: 88 %
PLATELETS: 286 10*3/uL (ref 150–400)
RBC: 5.02 MIL/uL (ref 4.22–5.81)
RDW: 13.4 % (ref 11.5–15.5)
WBC: 16.5 10*3/uL — ABNORMAL HIGH (ref 4.0–10.5)

## 2015-05-29 LAB — I-STAT CG4 LACTIC ACID, ED
LACTIC ACID, VENOUS: 2.3 mmol/L — AB (ref 0.5–2.0)
Lactic Acid, Venous: 3.92 mmol/L (ref 0.5–2.0)

## 2015-05-29 LAB — COMPREHENSIVE METABOLIC PANEL
ALBUMIN: 2.9 g/dL — AB (ref 3.5–5.0)
ALK PHOS: 177 U/L — AB (ref 38–126)
ALT: 28 U/L (ref 17–63)
AST: 49 U/L — ABNORMAL HIGH (ref 15–41)
Anion gap: 16 — ABNORMAL HIGH (ref 5–15)
BILIRUBIN TOTAL: 1.6 mg/dL — AB (ref 0.3–1.2)
BUN: 13 mg/dL (ref 6–20)
CALCIUM: 9.2 mg/dL (ref 8.9–10.3)
CO2: 20 mmol/L — AB (ref 22–32)
CREATININE: 0.59 mg/dL — AB (ref 0.61–1.24)
Chloride: 74 mmol/L — ABNORMAL LOW (ref 101–111)
GFR calc non Af Amer: >60 mL/min (ref >60)
Glucose, Bld: 96 mg/dL (ref 65–99)
Potassium: 4.7 mmol/L (ref 3.5–5.1)
SODIUM: 110 mmol/L — AB (ref 135–145)
Total Protein: 7.1 g/dL (ref 6.5–8.1)

## 2015-05-29 LAB — PROCALCITONIN: PROCALCITONIN: 0.19 ng/mL

## 2015-05-29 LAB — URINALYSIS, ROUTINE W REFLEX MICROSCOPIC
GLUCOSE, UA: NEGATIVE mg/dL
Ketones, ur: 15 mg/dL — AB
LEUKOCYTES UA: NEGATIVE
Nitrite: NEGATIVE
PH: 6 (ref 5.0–8.0)
PROTEIN: NEGATIVE mg/dL
Specific Gravity, Urine: 1.02 (ref 1.005–1.030)

## 2015-05-29 LAB — ETHANOL: Alcohol, Ethyl (B): <5 mg/dL (ref <5)

## 2015-05-29 LAB — BASIC METABOLIC PANEL
Anion gap: 12 (ref 5–15)
BUN: 14 mg/dL (ref 6–20)
CALCIUM: 8.3 mg/dL — AB (ref 8.9–10.3)
CHLORIDE: 75 mmol/L — AB (ref 101–111)
CO2: 22 mmol/L (ref 22–32)
CREATININE: 0.52 mg/dL — AB (ref 0.61–1.24)
Glucose, Bld: 123 mg/dL — ABNORMAL HIGH (ref 65–99)
Potassium: 4.8 mmol/L (ref 3.5–5.1)
SODIUM: 109 mmol/L — AB (ref 135–145)

## 2015-05-29 LAB — PROTIME-INR
INR: 1.35 (ref 0.00–1.49)
Prothrombin Time: 16.8 seconds — ABNORMAL HIGH (ref 11.6–15.2)

## 2015-05-29 LAB — RAPID URINE DRUG SCREEN, HOSP PERFORMED
AMPHETAMINES: NOT DETECTED
Barbiturates: NOT DETECTED
Benzodiazepines: POSITIVE — AB
COCAINE: NOT DETECTED
OPIATES: POSITIVE — AB
TETRAHYDROCANNABINOL: NOT DETECTED

## 2015-05-29 LAB — ACETAMINOPHEN LEVEL: Acetaminophen (Tylenol), Serum: <10 ug/mL — ABNORMAL LOW (ref 10–30)

## 2015-05-29 LAB — BRAIN NATRIURETIC PEPTIDE: B Natriuretic Peptide: 186.2 pg/mL — ABNORMAL HIGH (ref 0.0–100.0)

## 2015-05-29 LAB — URINE MICROSCOPIC-ADD ON: Squamous Epithelial / LPF: NONE SEEN

## 2015-05-29 LAB — SALICYLATE LEVEL: Salicylate Lvl: <4 mg/dL (ref 2.8–30.0)

## 2015-05-29 LAB — PHENYTOIN LEVEL, TOTAL: Phenytoin Lvl: 8.9 ug/mL — ABNORMAL LOW (ref 10.0–20.0)

## 2015-05-29 LAB — I-STAT TROPONIN, ED: Troponin i, poc: 0.05 ng/mL (ref 0.00–0.08)

## 2015-05-29 LAB — CBG MONITORING, ED: Glucose-Capillary: 105 mg/dL — ABNORMAL HIGH (ref 65–99)

## 2015-05-29 MED ORDER — DEXTROSE 5 % IV SOLN
500.0000 mg | INTRAVENOUS | Status: DC
  Filled 2015-05-29: qty 500

## 2015-05-29 MED ORDER — VANCOMYCIN HCL 10 G IV SOLR
1500.0000 mg | Freq: Once | INTRAVENOUS | Status: AC
  Administered 2015-05-29: 1500 mg via INTRAVENOUS
  Filled 2015-05-29: qty 1500

## 2015-05-29 MED ORDER — SODIUM CHLORIDE 0.9 % IV SOLN
INTRAVENOUS | Status: DC
  Administered 2015-05-29: 22:00:00 via INTRAVENOUS

## 2015-05-29 MED ORDER — DILTIAZEM HCL 25 MG/5ML IV SOLN
10.0000 mg | Freq: Once | INTRAVENOUS | Status: AC
  Administered 2015-05-29: 10 mg via INTRAVENOUS

## 2015-05-29 MED ORDER — ARFORMOTEROL TARTRATE 15 MCG/2ML IN NEBU
15.0000 ug | INHALATION_SOLUTION | Freq: Two times a day (BID) | RESPIRATORY_TRACT | Status: DC
  Administered 2015-05-29 – 2015-05-30 (×2): 15 ug via RESPIRATORY_TRACT
  Filled 2015-05-29 (×2): qty 2

## 2015-05-29 MED ORDER — CLOPIDOGREL BISULFATE 75 MG PO TABS
75.0000 mg | ORAL_TABLET | Freq: Every day | ORAL | Status: DC
  Filled 2015-05-29 (×3): qty 1

## 2015-05-29 MED ORDER — SODIUM CHLORIDE 3 % IV SOLN
INTRAVENOUS | Status: DC
  Administered 2015-05-30: 60 mL/h via INTRAVENOUS
  Filled 2015-05-29 (×2): qty 500

## 2015-05-29 MED ORDER — PANTOPRAZOLE SODIUM 40 MG IV SOLR
40.0000 mg | INTRAVENOUS | Status: DC
  Administered 2015-05-29 – 2015-05-31 (×3): 40 mg via INTRAVENOUS
  Filled 2015-05-29 (×4): qty 40

## 2015-05-29 MED ORDER — LEVALBUTEROL HCL 0.63 MG/3ML IN NEBU
0.6300 mg | INHALATION_SOLUTION | RESPIRATORY_TRACT | Status: DC | PRN
Start: 2015-05-29 — End: 2015-06-01
  Administered 2015-05-30: 0.63 mg via RESPIRATORY_TRACT
  Filled 2015-05-29 (×2): qty 3

## 2015-05-29 MED ORDER — DILTIAZEM LOAD VIA INFUSION
20.0000 mg | Freq: Once | INTRAVENOUS | Status: DC
  Filled 2015-05-29: qty 20

## 2015-05-29 MED ORDER — SODIUM CHLORIDE 0.9 % IV SOLN
250.0000 mL | INTRAVENOUS | Status: DC | PRN
  Administered 2015-06-01: 250 mL via INTRAVENOUS

## 2015-05-29 MED ORDER — CHLORHEXIDINE GLUCONATE 0.12 % MT SOLN
15.0000 mL | Freq: Two times a day (BID) | OROMUCOSAL | Status: DC
  Administered 2015-05-29 – 2015-06-01 (×5): 15 mL via OROMUCOSAL
  Filled 2015-05-29: qty 15

## 2015-05-29 MED ORDER — NALOXONE HCL 0.4 MG/ML IJ SOLN
0.1000 mg | Freq: Once | INTRAMUSCULAR | Status: AC
  Administered 2015-05-29: 0.1 mg via INTRAVENOUS
  Filled 2015-05-29: qty 1

## 2015-05-29 MED ORDER — CEFTRIAXONE SODIUM 1 G IJ SOLR
1.0000 g | Freq: Once | INTRAMUSCULAR | Status: AC
  Administered 2015-05-29: 1 g via INTRAVENOUS
  Filled 2015-05-29: qty 10

## 2015-05-29 MED ORDER — HEPARIN BOLUS VIA INFUSION
3500.0000 [IU] | Freq: Once | INTRAVENOUS | Status: AC
  Administered 2015-05-29: 3500 [IU] via INTRAVENOUS
  Filled 2015-05-29: qty 3500

## 2015-05-29 MED ORDER — CETYLPYRIDINIUM CHLORIDE 0.05 % MT LIQD
7.0000 mL | Freq: Two times a day (BID) | OROMUCOSAL | Status: DC
Start: 2015-05-30 — End: 2015-06-01
  Administered 2015-05-30 – 2015-05-31 (×3): 7 mL via OROMUCOSAL

## 2015-05-29 MED ORDER — BUDESONIDE 0.5 MG/2ML IN SUSP
0.5000 mg | Freq: Two times a day (BID) | RESPIRATORY_TRACT | Status: DC
  Administered 2015-05-29 – 2015-05-30 (×2): 0.5 mg via RESPIRATORY_TRACT
  Filled 2015-05-29 (×2): qty 2

## 2015-05-29 MED ORDER — DEXTROSE 5 % IV SOLN
500.0000 mg | Freq: Once | INTRAVENOUS | Status: AC
  Administered 2015-05-29: 500 mg via INTRAVENOUS
  Filled 2015-05-29: qty 500

## 2015-05-29 MED ORDER — HEPARIN (PORCINE) IN NACL 100-0.45 UNIT/ML-% IJ SOLN
1850.0000 [IU]/h | INTRAMUSCULAR | Status: DC
  Administered 2015-05-29: 1050 [IU]/h via INTRAVENOUS
  Administered 2015-05-30: 1350 [IU]/h via INTRAVENOUS
  Administered 2015-05-31: 1600 [IU]/h via INTRAVENOUS
  Administered 2015-06-01: 1850 [IU]/h via INTRAVENOUS
  Filled 2015-05-29 (×6): qty 250

## 2015-05-29 MED ORDER — VANCOMYCIN HCL IN DEXTROSE 1-5 GM/200ML-% IV SOLN
1000.0000 mg | Freq: Two times a day (BID) | INTRAVENOUS | Status: DC
  Administered 2015-05-30: 1000 mg via INTRAVENOUS
  Filled 2015-05-29 (×2): qty 200

## 2015-05-29 MED ORDER — SODIUM CHLORIDE 0.9 % IV SOLN: 500.0000 mg | Freq: Every day | INTRAVENOUS | Status: DC

## 2015-05-29 MED ORDER — SODIUM CHLORIDE 0.9 % IV SOLN
165.0000 mg | Freq: Three times a day (TID) | INTRAVENOUS | Status: DC
  Administered 2015-05-29 – 2015-06-01 (×8): 165 mg via INTRAVENOUS
  Filled 2015-05-29 (×15): qty 3.3

## 2015-05-29 MED ORDER — PIPERACILLIN-TAZOBACTAM 3.375 G IVPB
3.3750 g | Freq: Three times a day (TID) | INTRAVENOUS | Status: DC
  Administered 2015-05-30 (×2): 3.375 g via INTRAVENOUS
  Filled 2015-05-29 (×4): qty 50

## 2015-05-29 MED ORDER — SODIUM CHLORIDE 0.9 % IV SOLN
Freq: Once | INTRAVENOUS | Status: AC
  Administered 2015-05-29: 100 mL/h via INTRAVENOUS

## 2015-05-29 MED ORDER — SODIUM CHLORIDE 0.9 % IV BOLUS (SEPSIS)
500.0000 mL | Freq: Once | INTRAVENOUS | Status: AC
  Administered 2015-05-29: 500 mL via INTRAVENOUS

## 2015-05-29 MED ORDER — MAGNESIUM SULFATE 2 GM/50ML IV SOLN
2.0000 g | Freq: Once | INTRAVENOUS | Status: AC
  Administered 2015-05-29: 2 g via INTRAVENOUS
  Filled 2015-05-29: qty 50

## 2015-05-29 MED ORDER — DILTIAZEM HCL 100 MG IV SOLR
5.0000 mg/h | INTRAVENOUS | Status: DC
  Administered 2015-05-29 (×2): 5 mg/h via INTRAVENOUS
  Filled 2015-05-29 (×2): qty 100

## 2015-05-29 MED ORDER — ALBUTEROL (5 MG/ML) CONTINUOUS INHALATION SOLN
10.0000 mg/h | INHALATION_SOLUTION | Freq: Once | RESPIRATORY_TRACT | Status: AC
  Administered 2015-05-29: 10 mg/h via RESPIRATORY_TRACT
  Filled 2015-05-29: qty 20

## 2015-05-29 MED ORDER — IPRATROPIUM BROMIDE 0.02 % IN SOLN
0.5000 mg | RESPIRATORY_TRACT | Status: DC
  Administered 2015-05-29 – 2015-05-30 (×3): 0.5 mg via RESPIRATORY_TRACT
  Filled 2015-05-29 (×3): qty 2.5

## 2015-05-29 NOTE — Procedures (Signed)
Pt transported to CT then back to ED without complications.

## 2015-05-29 NOTE — ED Notes (Signed)
No response to narcan

## 2015-05-29 NOTE — Procedures (Signed)
Central Venous Catheter Insertion Procedure Note Ray Green [id number] 1950/11/25  Procedure: Insertion of Central Venous Catheter Indications: Assessment of intravascular volume, Drug and/or fluid administration and Frequent blood sampling  Procedure Details Consent: Risks of procedure as well as the alternatives and risks of each were explained to the (patient/caregiver).  Consent for procedure obtained. Time Out: Verified patient identification, verified procedure, site/side was marked, verified correct patient position, special equipment/implants available, medications/allergies/relevent history reviewed, required imaging and test results available.  Performed  Maximum sterile technique was used including antiseptics, cap, gloves, gown, hand hygiene, mask and sheet. Skin prep: Chlorhexidine; local anesthetic administered A antimicrobial bonded/coated triple lumen catheter was placed in the left internal jugular vein using the Seldinger technique.  Evaluation Blood flow good Complications: No apparent complications Patient did tolerate procedure well. Chest X-ray ordered to verify placement.  CXR: pending.  Procedure performed under direct ultrasound guidance for real time vessel cannulation.      Ray Green, [place] - [facility] Pager: [phone]  or [phone] 06/04/2015, 11:11 PM

## 2015-05-29 NOTE — ED Notes (Signed)
Rahul, PA at bedside and informed of pts sodium of 109.

## 2015-05-29 NOTE — H&P (Signed)
PULMONARY / CRITICAL CARE MEDICINE   Name: Ray Green MRN: [id number] DOB: 12-25-1950    ADMISSION DATE:  05/16/2015 CONSULTATION DATE:  06/02/2015  REFERRING MD:  EDP  CHIEF COMPLAINT:  AMS, SOB  HISTORY OF PRESENT ILLNESS:  Pt is encephelopathic; therefore, this HPI is obtained from chart review. Ray Green is a 65 y.o. male with PMH as outlined below including COPD, previously seen by Dr. Sherene SiresWert and who continues to smoke.  He had SOB the evening of 05/24 and EMS was dispatched; however, he refused to come to the hospital and felt that symptoms would resolve. The following day, 5/25, his home aid (who helps him with some daily chores) noted that he was more somnolent than usual and was "in and out of it".  Later that afternoon, he developed increased WOB and complained of SOB again.  His SOB worsened to the point that he could barely speak.  On EMS arrival, he had SpO2 of 85% on RA which improved after he was placed on NRB.  In ED, he remained somnolent and had mild increased WOB; therefore, he was placed on BiPAP.  CXR revealed diffuse right sided infiltrate and lab work showed multiple metabolic derangements including Na 110.  Daughter states pt is stubborn and does NOT want to go to ED. The reason he was brought in today was because he was out and obtunded. Per daughter, pt has NOT been eating for several days, not sure if he has been taking his meds.   PAST MEDICAL HISTORY :  He  has a past medical history of Hyponatremia; Anemia; Incontinence, feces; Lumbar back pain; Alcohol abuse; HH (hiatus hernia); Chronic bronchitis; TIA (transient ischemic attack); Osteoporosis; Seizure disorder (HCC); Hypertension; GERD (gastroesophageal reflux disease); COPD (chronic obstructive pulmonary disease) (HCC); DDD (degenerative disc disease), cervical; RLS (restless legs syndrome); and Seizures (HCC).  PAST SURGICAL HISTORY: He  has past surgical history that includes Rotator cuff repair;  Lumbar laminectomy/decompression microdiscectomy; Inguinal hernia repair (02/28/95); and Brain surgery.  No Known Allergies  No current facility-administered medications on file prior to encounter.   Current Outpatient Prescriptions on File Prior to Encounter  Medication Sig  . albuterol (PROVENTIL) (2.5 MG/3ML) 0.083% nebulizer solution inhale contents of 1 vial in nebulizer every 6 hours if needed for wheezing  . budesonide-formoterol (SYMBICORT) 160-4.5 MCG/ACT inhaler Inhale 2 puffs into the lungs 2 (two) times daily.  . clopidogrel (PLAVIX) 75 MG tablet Take 75 mg by mouth daily.    . diazepam (VALIUM) 5 MG tablet Take 5 mg by mouth every 12 (twelve) hours as needed.   . furosemide (LASIX) 40 MG tablet Take 40 mg by mouth daily.   [name]. levalbuterol (XOPENEX HFA) 45 MCG/ACT inhaler inhale 2 puffs every 6 hours if needed for wheezing  . losartan (COZAAR) 25 MG tablet Take 25 mg by mouth daily.  [name]. morphine (MS CONTIN) 60 MG 12 hr tablet Take 60 mg by mouth every 12 (twelve) hours.   [name]. omeprazole (PRILOSEC) 20 MG capsule take 1 capsule by mouth once daily  . phenytoin (DILANTIN) 100 MG ER capsule Take 5 caps by mouth once daily . MUST COME TO APPT BEFORE FURTHER REFILLS.  [name]. pregabalin (LYRICA) 75 MG capsule Take 75 mg by mouth every 8 (eight) hours.    [name]. QUEtiapine (SEROQUEL) 25 MG tablet Take 25 mg by mouth at bedtime.   [name]. rOPINIRole (REQUIP) 4 MG tablet Take 4 mg by mouth at bedtime.  FAMILY HISTORY:  His indicated that his mother is alive. He indicated that his father is deceased.   Per daughter, bot parents are deceased but his step father is alive. (-) medical issue with daughter.   SOCIAL HISTORY: He  reports that he has been smoking Cigarettes.  He has been smoking about 1.00 pack per day. He has never used smokeless tobacco. He reports that he does not drink alcohol or use illicit drugs.  Pt lives by himself, tries to do things by himself. Has an aide who checks on him daily.  (-) ETOH.   REVIEW OF SYSTEMS:  Unable to obtain as pt is encephalopathic.  SUBJECTIVE:  On BiPAP, obtunded.  VITAL SIGNS: BP 85/59 mmHg  Pulse 88  Temp(Src) 96.2 F (35.7 C) (Rectal)  Resp 16  SpO2 100%  HEMODYNAMICS:    VENTILATOR SETTINGS: Vent Mode:  [-] BIPAP;PCV FiO2 (%):  [50 %] 50 % Set Rate:  [15 bmp] 15 bmp PEEP:  [5 cmH20] 5 cmH20  INTAKE / OUTPUT:     PHYSICAL EXAMINATION: General: Elderly chronically ill appearing male, in NAD. Neuro: Obtunded. HEENT: Ray Green/AT. PERRL, sclerae anicteric.  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 Cardiovascular: IRIR, no M/R/G.  Lungs: Respirations even and unlabored.  Diminished throughout right side.  Left with basilar crackles. Abdomen: BS x 4, soft, NT/ND.  Musculoskeletal: No gross deformities, 2+ edema bilaterally.  Skin: Intact, warm, no rashes.  LABS:  BMET  Recent Labs Lab 2015-06-03 1620 06/03/15 2040  NA 110* 109*  K 4.7 4.8  CL 74* 75*  CO2 20* 22  BUN 13 14  CREATININE 0.59* 0.52*  GLUCOSE 96 123*    Electrolytes  Recent Labs Lab 2015-06-03 1620 06/03/2015 2040  CALCIUM 9.2 8.3*    CBC  Recent Labs Lab 06/03/2015 1620  WBC 16.5*  HGB 14.8  HCT 43.4  PLT 286    Coag's  Recent Labs Lab 2015/06/03 1620  INR 1.35    Sepsis Markers  Recent Labs Lab 06/03/2015 1638 2015/06/03 2045  LATICACIDVEN 3.92* 2.30*    ABG  Recent Labs Lab Jun 03, 2015 1553  PHART 7.320*  PCO2ART 50.9*  PO2ART 165.0*    Liver Enzymes  Recent Labs Lab Jun 03, 2015 1620  AST 49*  ALT 28  ALKPHOS 177*  BILITOT 1.6*  ALBUMIN 2.9*    Cardiac Enzymes No results for input(s): TROPONINI, PROBNP in the last 168 hours.  Glucose  Recent Labs Lab 03-Jun-2015 1656  GLUCAP 105*    Imaging Ct Head Wo Contrast  06-03-2015  CLINICAL DATA:  Altered mental status and possible seizure EXAM: CT HEAD WITHOUT CONTRAST TECHNIQUE: Contiguous axial images were obtained from the base of the skull through the vertex without  intravenous contrast. COMPARISON:  04/23/2013 FINDINGS: Diffuse cortical atrophy with encephalomalacia left parietal lobe related to prior trauma. This is stable. Periventricular white matter low attenuation is stable as well. Craniotomy defect left posterior parietal lobe again identified. However, there is now a soft tissue mass associated with the craniotomy defect measuring 5 cm in diameter by a 2.5 cm. Involves both the inner and outer table of adjacent bone and causes thinning of the calvarium surrounding the craniotomy site. There is also a lytic defect in the posterior central parietal bone measuring 14 mm. IMPRESSION: 1. No acute intracranial abnormalities.  Evidence prior trauma. 2. Soft tissue mass in the scalp in the area of craniotomy defect associated with both the inner and outer table. Widespread abnormal lucency throughout the posterior left parietal  bone and central parietal bone. This is new from prior study. Differential diagnostic possibilities include malignant soft tissue mass with osseous involvement, as well as metastatic disease. Recommend contrast-enhanced MRI to better characterize. Electronically Signed   By: Esperanza Heir M.D.   On: 2015/06/23 21:03   Dg Chest Portable 1 View  06-23-15  CLINICAL DATA:  Respiratory distress EXAM: PORTABLE CHEST 1 VIEW COMPARISON:  09/06/2014 FINDINGS: Cardiac shadow is stable. Diffuse infiltrate is noted throughout the right lung as well as some pleural thickening and pleural effusion. These changes are new from the prior exam. The left lung remains clear. No bony abnormality is noted. IMPRESSION: Diffuse infiltrative change in the right lung with pleural thickening and pleural effusion. CT may be helpful for further evaluation. Electronically Signed   By: Alcide Clever M.D.   On: 2015-06-23 16:10     STUDIES:  CXR 05/25 > diffuse right sided infiltrate with ? Pleural thickening and pleural effusion. CT head 05/25 > no acute process.   Widespread abnormal lucency throughout the posterior left parietal lobe and central parietal bone, DDx includes malignant soft tissue mass with osseous involvement and metastatic disease.  Recommend MRI brain 05/25 > Echo 05/26 >  CULTURES: Blood 05/25 > Sputum 05/25 > Urine 05/25 >  ANTIBIOTICS: Vanc 05/25 > Zosyn 05/25 >  SIGNIFICANT EVENTS: 05/25 > admitted with acute hypoxic respiratory failure, CAP, AMS, hyponatremia.  LINES/TUBES: CVL pending 05/25 >  DISCUSSION: 65 y.o. M admitted 05/25 with acute hypoxic respiratory failure, CAP, AMS, hyponatremia.  ASSESSMENT / PLAN:  RENAL A:   Hyponatremia - significant with AMS (110).  Pseudohypocalcemia - corrects to 9.18. P:   Will place CVL and start 3% NS @ 24ml/hr for goal correction of no more than 0.33mmol/L/hr. BMP q2hrs. Assess ionized calcium.  NEUROLOGIC A:   Acute encephalopathy - presumed due to hyponatremia +/- contribution of sepsis. ? Malignant soft tissue mass vs metastatic disease - area of concern seen on head CT. Hx TIA, seizures, DDD, RLS. P:   Na correction per renal section. Assess Brain MRI. Continue outpatient plavix, phenytoin. Assess phenytoin level. Hold outpatient diazepam, morphine, pregabalin, quetiapine, ropinirole.  PULMONARY A: Acute hypoxic respiratory failure. CAP - diffuse right sided infiltrate. Probable right pleural effusion. COPD without exacerbation. Tobacco dependence. DNI STATUS. P:   Continue BiPAP - no intubation. Abx / cultures per ID section. Diuresis restricted due to hypotension. Budesonide / Brovana in lieu of outpatient Symbicort. Levalbuterol PRN. Pulmonary hygiene. CXR in AM. Tobacco cessation counseling.  CARDIOVASCULAR A:  A.fib with RVR - new onset. Hx HTN. P:  Continue diltiazem as able (caution given labile BP's). Start heparin gtt. Continue outpatient plavix. Hold outpatient furosemide, losartan. Repeat lactate. Assess  echo.  INFECTIOUS A:   Sepsis - presumed due to CAP (diffuse right sided infiltrate). P:   Will cover broadly for now (vanc / zosyn).  Follow cultures as above. PCT algorithm to limit abx exposure.  GASTROINTESTINAL A:   GERD. Nutrition. P:   Pantoprazole. NPO.  HEMATOLOGIC A:   VTE Prophylaxis. P:  SCD's / Heparin gtt. CBC in AM.  ENDOCRINE A:   No acute issues. P:   Assess TSH.   Family updated: Daughter, Father, Brother updated at bedside.  Interdisciplinary Family Meeting v Palliative Care Meeting:  Due by: 06/04/15.  CC time: 40 minutes.   Rutherford Guys, [place] - C  Pulmonary & Critical Care Medicine Pager: [phone]  or [phone] 06-23-15, 9:30 PM  ATTENDING NOTE / ATTESTATION NOTE :   I have discussed the case with the resident/APP Rahul Desai.  I agree with the resident/APP's  history, physical examination, assessment, and plans.  I have edited the above note and modified it according to our agreed history, physical examination, assessment and plan.    Briefly, pt with known COPD and HTN, who refuses to go to the hospital, was brought in by EMS 2/2 worsening SOB and AMS.  Per daughter, patient has not been eating for several days. Not sure if he has been taking his medicines. He has worsening dyspnea over the last couple days. Today, aid  noted that he was obtunded so EMS was called and patient was brought into the emergency room. Patient was found to have new onset atrial fibrillation. Blood pressure was 120 systolic. Hypothermic at 38F. O2 saturation of 100% on BiPAP 50% FiO2. Obtunded. Does not follow commands. (-) NVD. Very diminished breath sounds on the right lung field. Variable S1. Soft abdomen, no ascites noted. No masses. Trace to grade 1 peripheral edema. Warm extremities. Cranial nerves grossly intact.  Labs reveal a white count of  16.5. Sodium was 109. Previously it was 135 in January 2017. Chloride was low at 75 as well.  Bicarbonate 22. Creatinine 0.52. Sugar was 123. Initial  lactate was 3.92, repeat was 2.30. BNP was 186. Urinalysis with specific gravity of 1.020. Troponin was 0.05. Chest x-ray personally reviewed. Infiltrate in the right mid to lower lung zone. Cannot rule out a lung mass. Also with effusion on the right side and pleural thickening. Cranial CT scan with a soft tissue mass in the scalp in the area of craniotomy defect.  Most pressing issue for this patient is the hyponatremia. His computed serum osmolality is 230. I'm waiting on serum osmolality, urine sodium, urine osmolality. Patient likely has hypotonic hyponatremia. Hyponatremia is multifactorial: Secondary to poor PO intake, not sure if he was still taking the Lasix despite not eating, could also be related to SIADH from possible lung malignancy or infection (PNA), could also be 2/2  mild CHF given his new atrial fibrillation and some peripheral edema. Pt was also on Seroquel which can cause hyponatremia.    He is currently being hydrated as his blood pressure is on the soft side. Plan to treat him with hypertonic saline but need to check sodium every 2 hours so as not to correct him to rapidly and cause ODS. Plan to correct Na by 6-8 meq/L in a 24 hr period. Will d/c hypertonic saline if Na corrects too rapidly.  As mentioned, pt has already been hydrated with NS.  Need to order : Urine sodium, urine osmolality, serum osmolality, TSH, cortisol level. Will hold further diuresis and hold seroquel and other meds that can cause HypoNa.    Pt will need a chest ct scan to better look at the R lung.  Need to R/O Lung CA and possible CNS mets.   Pt is also being treated for severe PNA. Continue with broad spectrum Abx -- zosyn and vanc. Cont azithro as well to cover possible legionella. Deescalate once with cultures. Will send for urinary Ag for Legionella and strep as well. Cont BipaP for now. Pt is DNI.   Cont neb meds for COPD. He is not in acute  exacerbation.   Cont heparin drip for Afib.  Trend troponin. Check Echo.  Keep NPO.   Cont other meds.   I have spent 32 minutes of critical care time  with this patient today.  Family :Daughter (who makes decision for pt) has been updated at bedisde. Shela Commons. Alexis Frock, MD May 31, 2015, 10:58 PM [place] [facility] Pager [phone] After 3 pm or if no answer, call [phone]

## 2015-05-29 NOTE — ED Notes (Signed)
This RN called CT to determine what the delay is with having the CT scan results. CT tech states she will look into it and call this RN back.

## 2015-05-29 NOTE — ED Notes (Signed)
Dr. Karma GanjaLinker informed of pts BP, 82/61

## 2015-05-29 NOTE — ED Notes (Signed)
Pt here from home via [facility] for respiratory distress.  [facility] called to pt home last night four times for sob, but pt was ao x 4 and refused.  Today home healthcare RN called [facility] and pt is not alert enough to refuse.  Initial O2 sats of 85%.  Pt given 1 mg atrovent, 15 mg albuterol and 125 solumedrol en-route.

## 2015-05-29 NOTE — Progress Notes (Addendum)
Pharmacy Antibiotic Note Lonzo A Delton Seeelson is a 65 y.o. male admitted on May 03, 2015 with acute on chronic respiratpory distress and lactic acid of 3.92 Pt was given Ceftriaxone and Azithromycin x 1 in the ER .  Pharmacy has been consulted for Zosyn and vancomycin dosing for potential sepsis.  Plan: 1. Vancomycin 1000 IV every 12 hours.  Goal trough 15-20 mcg/mL.  2. Zosyn 3.375 grams IV every 8 hours (infused over 4 hours) 3. Follow up culture data and other pertinent labs/test and narrow abx as feasible    Temp (24hrs), Avg:96.2 F (35.7 C), Min:96.2 F (35.7 C), Max:96.2 F (35.7 C)   Recent Labs Lab 05/13/2015 1620 05/13/2015 1638  WBC 16.5*  --   CREATININE 0.59*  --   LATICACIDVEN  --  3.92*    CrCl cannot be calculated (Unknown ideal weight.).    No Known Allergies  Antimicrobials this admission: 5/25 Ceftriaxone  X 1 dose 5/25 Azithromycin x 1 dose  5/25 Vancomycin >>  5/25 Zosyn >>  Dose adjustments this admission: n/a  Microbiology results: 5/25 BCx: pending   Thank you for allowing pharmacy to be a part of this patient's care.  Pollyann SamplesAndy Charlyn Vialpando, PharmD, BCPS May 03, 2015, 5:42 PM Pager: [phone]

## 2015-05-29 NOTE — Progress Notes (Signed)
Patient placed on Bipap with a large mask.

## 2015-05-29 NOTE — ED Provider Notes (Signed)
CSN: [id number]     Arrival date & time 05/28/2015  1535 History   First MD Initiated Contact with Patient 05/12/2015 1546     Chief Complaint  Patient presents with  . Respiratory Distress   The history is provided by a caregiver, medical records and the EMS personnel. No language interpreter was used.    Patient is a 65 year old male with past medical history of COPD and possible heart failure presents in respiratory distress. Patient is in severe respiratory distress and is unable to provide any history. Further history obtained by EMS. Per report EMS was called to evaluate the patient for similar symptoms yesterday but he refused transport at that time. Today home health nurse called EMS after noting that patient appeared very fatigued and work of breathing had increased. Per EMS, initial O2 sats 85% on room air. He received albuterol neb, ipratropium, and 125 mg of Solu-Medrol prior to arrival. He was placed on a nonrebreather with improvement in his O2 sats.  History is limited by condition of patient - respiratory distress  Further history obtained on family arrival. Reportedly patient is normally ambulatory without assistance, talkative, and oriented x4. Over the past week he has had worsening confusion, possibly difficulty with speech (although different family members disagree), and generalized weakness. Patient does have history of strokes in the past per family. They have not noted a fever at home. He has had decreased po intake over past few days. Patient is a 1ppd smoker and has a history of alcohol abuse, though family does not think he has been drinking lately. He also takes MS contin twice daily for chronic back pain. No other known drug use.   Past Medical History  Diagnosis Date  . Hyponatremia   . Anemia   . Incontinence, feces   . Lumbar back pain   . Alcohol abuse   . HH (hiatus hernia)   . Chronic bronchitis   . TIA (transient ischemic attack)   . Osteoporosis   . Seizure  disorder (HCC)   . Hypertension   . GERD (gastroesophageal reflux disease)   . COPD (chronic obstructive pulmonary disease) (HCC)   . DDD (degenerative disc disease), cervical   . RLS (restless legs syndrome)   . Seizures [facility](HCC)    Past Surgical History  Procedure Laterality Date  . Rotator cuff repair      left  . Lumbar laminectomy/decompression microdiscectomy    . Inguinal hernia repair  02/28/95    rt   Family History  Problem Relation Age of Onset  . Bone cancer Mother   . Coronary artery disease     Social History  Substance Use Topics  . Smoking status: Current Every Day Smoker -- 1.00 packs/day    Types: Cigarettes  . Smokeless tobacco: Never Used  . Alcohol Use: No     Comment: pt quit 5 years ago.     Review of Systems  Unable to perform ROS: Acuity of condition      Allergies  Review of patient's allergies indicates no known allergies.  Home Medications   Prior to Admission medications   Medication Sig Start Date End Date Taking? Authorizing Provider  albuterol (PROVENTIL) (2.5 MG/3ML) 0.083% nebulizer solution inhale contents of 1 vial in nebulizer every 6 hours if needed for wheezing    Storm FriskPatrick E Wright, MD  budesonide-formoterol [facility](SYMBICORT) 160-4.5 MCG/ACT inhaler Inhale 2 puffs into the lungs 2 (two) times daily. 04/07/15   Nyoka CowdenMichael B Wert, MD  clopidogrel (PLAVIX)  75 MG tablet Take 75 mg by mouth daily.      Historical Provider, MD  diazepam (VALIUM) 5 MG tablet Take 5 mg by mouth every 12 (twelve) hours as needed.     Historical Provider, MD  furosemide (LASIX) 40 MG tablet Take 40 mg by mouth daily.     Historical Provider, MD  levalbuterol [facility] HFA) 45 MCG/ACT inhaler inhale 2 puffs every 6 hours if needed for wheezing 04/07/15   Nyoka Cowden, MD  losartan (COZAAR) 25 MG tablet Take 25 mg by mouth daily. 01/23/15   Historical Provider, MD  morphine (MS CONTIN) 60 MG 12 hr tablet Take 60 mg by mouth every 12 (twelve) hours.  01/29/15   Historical  Provider, MD  omeprazole (PRILOSEC) 20 MG capsule take 1 capsule by mouth once daily 11/17/10   Storm Frisk, MD  phenytoin (DILANTIN) 100 MG ER capsule Take 5 caps by mouth once daily . MUST COME TO APPT BEFORE FURTHER REFILLS. 01/31/15   Nilda Riggs, NP  pregabalin (LYRICA) 75 MG capsule Take 75 mg by mouth every 8 (eight) hours.      Historical Provider, MD  QUEtiapine (SEROQUEL) 25 MG tablet Take 25 mg by mouth at bedtime.  01/29/15   Historical Provider, MD  rOPINIRole (REQUIP) 4 MG tablet Take 4 mg by mouth at bedtime.      Historical Provider, MD   ED Triage Vitals  Enc Vitals Group     BP 05/24/2015 1545 123/101 mmHg     Pulse Rate 05/15/2015 1645 75     Resp 05/05/2015 1545 28     Temp 05/08/2015 1708 96.2 F (35.7 C)     Temp Source 06/03/2015 1708 Rectal     SpO2 05/24/2015 1545 100 %     Weight 05/16/2015 2115 171 lb (77.565 kg)     Height 05/20/2015 2115 5\' 9"  (1.753 m)     Head Cir --      Peak Flow --      Pain Score --      Pain Loc --      Pain Edu? --      Excl. in GC? --     Physical Exam  Constitutional: He appears well-nourished. He appears distressed.  HENT:  Head: Normocephalic and atraumatic.  Eyes: EOM are normal. Pupils are equal, round, and reactive to light.  Neck: Normal range of motion. Neck supple.  Cardiovascular: Normal rate, regular rhythm and intact distal pulses.   Pulmonary/Chest: He is in respiratory distress.  Tight breath sounds with scattered expiratory wheezes, diminished on right side > left  Abdominal: Soft. He exhibits no distension. There is no tenderness.  Musculoskeletal: He exhibits edema (bilateral LE pitting edema).  Neurological:  Somnolent, but will open eyes to voice and follow simple commands.   Skin: Skin is warm and dry.  Nursing note and vitals reviewed.   ED Course  Procedures (including critical care time) Labs Review Labs Reviewed  COMPREHENSIVE METABOLIC PANEL - Abnormal; Notable for the following:    Sodium 110  (*)    Chloride 74 (*)    CO2 20 (*)    Creatinine, Ser 0.59 (*)    Albumin 2.9 (*)    AST 49 (*)    Alkaline Phosphatase 177 (*)    Total Bilirubin 1.6 (*)    Anion gap 16 (*)    All other components within normal limits  CBC WITH DIFFERENTIAL/PLATELET - Abnormal; Notable for the following:  WBC 16.5 (*)    Neutro Abs 14.5 (*)    All other components within normal limits  BRAIN NATRIURETIC PEPTIDE - Abnormal; Notable for the following:    B Natriuretic Peptide 186.2 (*)    All other components within normal limits  PROTIME-INR - Abnormal; Notable for the following:    Prothrombin Time 16.8 (*)    All other components within normal limits  ACETAMINOPHEN LEVEL - Abnormal; Notable for the following:    Acetaminophen (Tylenol), Serum <10 (*)    All other components within normal limits  I-STAT ARTERIAL BLOOD GAS, ED - Abnormal; Notable for the following:    pH, Arterial 7.320 (*)    pCO2 arterial 50.9 (*)    pO2, Arterial 165.0 (*)    Bicarbonate 26.2 (*)    All other components within normal limits  CBG MONITORING, ED - Abnormal; Notable for the following:    Glucose-Capillary 105 (*)    All other components within normal limits  I-STAT CG4 LACTIC ACID, ED - Abnormal; Notable for the following:    Lactic Acid, Venous 3.92 (*)    All other components within normal limits  CULTURE, BLOOD (ROUTINE X 2)  CULTURE, BLOOD (ROUTINE X 2)  ETHANOL  SALICYLATE LEVEL  URINE RAPID DRUG SCREEN, HOSP PERFORMED  URINALYSIS, ROUTINE W REFLEX MICROSCOPIC (NOT AT [facility])  Rosezena Sensor, ED    Imaging Review Dg Chest Portable 1 View  30-May-2015  CLINICAL DATA:  Respiratory distress EXAM: PORTABLE CHEST 1 VIEW COMPARISON:  09/06/2014 FINDINGS: Cardiac shadow is stable. Diffuse infiltrate is noted throughout the right lung as well as some pleural thickening and pleural effusion. These changes are new from the prior exam. The left lung remains clear. No bony abnormality is noted. IMPRESSION:  Diffuse infiltrative change in the right lung with pleural thickening and pleural effusion. CT may be helpful for further evaluation. Electronically Signed   By: Alcide Clever M.D.   On: 2015/05/30 16:10   I have personally reviewed and evaluated these images and lab results as part of my medical decision-making.   EKG Interpretation   Date/Time:  Thursday 05-30-15 15:39:51 EDT Ventricular Rate:  136 PR Interval:    QRS Duration: 105 QT Interval:  314 QTC Calculation: 472 R Axis:   89 Text Interpretation:  Atrial fibrillation Ventricular premature complex  Borderline right axis deviation Anteroseptal infarct, old Since previous  tracing atrial fibrillation is new and rate faster Confirmed by Karma Ganja   MD, MARTHA [phone]) on 05/30/15 3:47:57 PM      MDM   Final diagnoses:  Altered mental state  Respiratory distress  Respiratory disease    Patient is a 65 year old male with past medical history as above who presents via EMS in respiratory distress. He received albuterol, Atrovent, Solu-Medrol and oxygen via nonrebreather prior to arrival.   On presentation somnolent but arousable to voice. He is tachycardic with an irregularly irregular rhythm but normotensive. O2 sats 97% on BiPAP. Breath sounds diminished R>L. He is leaning to the right side appears to have difficulty sitting up. He will follow simple commands. He was placed on BiPAP and arterial blood gas obtained. Continuos albuterol, IV magnesium given.  ABG shows mild respiratory acidosis with pH of 7.3 and PCO2 of 50, without hypoxia. EKG shows atrial fibrillation rate in the 130s. It is unclear whether patient has had a history of this in the past. On reviewing the medical records it appears that patient takes MS Contin 60 mg every  12 hours. Small dose of Narcan given for possible opioid toxicity without significant improvement.. CBC, CMP, BNP, INR, alcohol, acetaminophen, salicylate level and UDS are pending.  Chest x-ray  with diffuse infiltrate in the right lung. Blood cultures were obtained and patient was started on azithromycin, ceftriaxone and vancomycin for pneumonia. Patient's mental status improved on BiPAP and he became slightly more alert, opening eyes spontaneously nodding yes and no to questions.  Labs are significant for severe hyponatremia of 110, lactic acidosis at 3.8, leukocytosis. ETOH, acetaminophen, salicylates negative. UDS positive for opiates and benzodiazepines, neither of which he received in the ED. Gentle IV fluids started at 100cc/hr. Patient continued to be tachycardic between 120s and 160s with atrial fibrillation on the monitor. However blood pressures remained soft. He was started on a diltiazem  bolus + drip. CT head ordered. Repeat BMP pending. Patient became hypotensive with SBP in 80s and received 500cc bolus with transient improvement.   CT head without acute stroke but does show lucencies in parietal bone and soft tissue mass concerning for malignancy, possibly metastatic disease.  Intensivist consulted for admission and triage of patient. Will admit to the ICU for further monitoring and management of hyponatremia, atrial fibrillation, and respiratory failure secondary to pneumonia.  Patient seen and discussed with Dr. Karma Ganja, ED attending.       Isa Rankin, MD 05/30/15 [id number]  Jerelyn Scott, MD 05/31/15 [phone]

## 2015-05-29 NOTE — Procedures (Signed)
Pt transported to 2M13 without complications.

## 2015-05-29 NOTE — Progress Notes (Signed)
ANTICOAGULATION CONSULT NOTE - Initial Consult  Pharmacy Consult for heparin Indication: atrial fibrillation  No Known Allergies  Patient Measurements: Height: 5\' 9"  (175.3 cm) Weight: 171 lb (77.565 kg) IBW/kg (Calculated) : 70.7 Heparin Dosing Weight: 77.6 kg  Vital Signs: Temp: 96.2 F (35.7 C) (05/25 1708) Temp Source: Rectal (05/25 1708) BP: 85/59 mmHg (05/25 2115) Pulse Rate: 88 (05/25 2115)  Labs:  Recent Labs  05/11/2015 1620 05/11/2015 2040  HGB 14.8  --   HCT 43.4  --   PLT 286  --   LABPROT 16.8*  --   INR 1.35  --   CREATININE 0.59* 0.52*    Estimated Creatinine Clearance: 90.8 mL/min (by C-G formula based on Cr of 0.52).   Medical History: Past Medical History  Diagnosis Date  . Hyponatremia   . Anemia   . Incontinence, feces   . Lumbar back pain   . Alcohol abuse   . HH (hiatus hernia)   . Chronic bronchitis   . TIA (transient ischemic attack)   . Osteoporosis   . Seizure disorder (HCC)   . Hypertension   . GERD (gastroesophageal reflux disease)   . COPD (chronic obstructive pulmonary disease) (HCC)   . DDD (degenerative disc disease), cervical   . RLS (restless legs syndrome)   . Seizures (HCC)     Medications:  Scheduled:  . arformoterol  15 mcg Nebulization BID  . budesonide (PULMICORT) nebulizer solution  0.5 mg Nebulization BID  . [START ON 05/30/2015] clopidogrel  75 mg Oral Daily    Assessment: 65 yo man admitted 05/31/2015 with severe respiratory distress & concern for pna, found to be in AFib with RVR. CHADSVASc at least 4. Pharmacy consulted to dose heparin.  No anticoagulation prior to admission. Hgb 14.8, plt wnl.   Goal of Therapy:  Heparin level 0.3-0.7 units/ml Monitor platelets by anticoagulation protocol: Yes   Plan:  Heparin 3500 units x 1 Heparin 1050 units/h AM HL Daily HL, CBC Monitor s/sx bleeding  Hillery AldoElizabeth Doss Cybulski, Pharm.D., BCPS PGY2 Cardiology Pharmacy Resident Pager: [phone] 05/09/2015,9:38  PM

## 2015-05-29 NOTE — ED Notes (Signed)
Report attempted 

## 2015-05-30 ENCOUNTER — Inpatient Hospital Stay (HOSPITAL_COMMUNITY): Payer: Medicare Other

## 2015-05-30 DIAGNOSIS — E871 Hypo-osmolality and hyponatremia: Secondary | ICD-10-CM

## 2015-05-30 DIAGNOSIS — R0600 Dyspnea, unspecified: Secondary | ICD-10-CM

## 2015-05-30 DIAGNOSIS — J9602 Acute respiratory failure with hypercapnia: Secondary | ICD-10-CM

## 2015-05-30 DIAGNOSIS — J9601 Acute respiratory failure with hypoxia: Secondary | ICD-10-CM

## 2015-05-30 DIAGNOSIS — G9340 Encephalopathy, unspecified: Secondary | ICD-10-CM

## 2015-05-30 DIAGNOSIS — J948 Other specified pleural conditions: Secondary | ICD-10-CM

## 2015-05-30 LAB — BASIC METABOLIC PANEL
ANION GAP: 7 (ref 5–15)
ANION GAP: 8 (ref 5–15)
ANION GAP: 7 (ref 5–15)
ANION GAP: 7 (ref 5–15)
ANION GAP: 8 (ref 5–15)
ANION GAP: 8 (ref 5–15)
Anion gap: 9 (ref 5–15)
Anion gap: 10 (ref 5–15)
Anion gap: 8 (ref 5–15)
BUN: 12 mg/dL (ref 6–20)
BUN: 12 mg/dL (ref 6–20)
BUN: 11 mg/dL (ref 6–20)
BUN: 9 mg/dL (ref 6–20)
BUN: 5 mg/dL — AB (ref 6–20)
BUN: 6 mg/dL (ref 6–20)
BUN: 7 mg/dL (ref 6–20)
BUN: 7 mg/dL (ref 6–20)
BUN: 9 mg/dL (ref 6–20)
CALCIUM: 8.3 mg/dL — AB (ref 8.9–10.3)
CALCIUM: 7.9 mg/dL — AB (ref 8.9–10.3)
CALCIUM: 8 mg/dL — AB (ref 8.9–10.3)
CALCIUM: 8 mg/dL — AB (ref 8.9–10.3)
CHLORIDE: 82 mmol/L — AB (ref 101–111)
CHLORIDE: 83 mmol/L — AB (ref 101–111)
CHLORIDE: 81 mmol/L — AB (ref 101–111)
CHLORIDE: 84 mmol/L — AB (ref 101–111)
CHLORIDE: 86 mmol/L — AB (ref 101–111)
CO2: 27 mmol/L (ref 22–32)
CO2: 26 mmol/L (ref 22–32)
CO2: 27 mmol/L (ref 22–32)
CO2: 27 mmol/L (ref 22–32)
CO2: 25 mmol/L (ref 22–32)
CO2: 26 mmol/L (ref 22–32)
CO2: 27 mmol/L (ref 22–32)
CO2: 26 mmol/L (ref 22–32)
CO2: 26 mmol/L (ref 22–32)
CREATININE: 0.52 mg/dL — AB (ref 0.61–1.24)
Calcium: 8 mg/dL — ABNORMAL LOW (ref 8.9–10.3)
Calcium: 8.4 mg/dL — ABNORMAL LOW (ref 8.9–10.3)
Calcium: 8.1 mg/dL — ABNORMAL LOW (ref 8.9–10.3)
Calcium: 7.8 mg/dL — ABNORMAL LOW (ref 8.9–10.3)
Calcium: 8 mg/dL — ABNORMAL LOW (ref 8.9–10.3)
Chloride: 77 mmol/L — ABNORMAL LOW (ref 101–111)
Chloride: 79 mmol/L — ABNORMAL LOW (ref 101–111)
Chloride: 81 mmol/L — ABNORMAL LOW (ref 101–111)
Chloride: 82 mmol/L — ABNORMAL LOW (ref 101–111)
Creatinine, Ser: 0.49 mg/dL — ABNORMAL LOW (ref 0.61–1.24)
Creatinine, Ser: 0.53 mg/dL — ABNORMAL LOW (ref 0.61–1.24)
Creatinine, Ser: 0.54 mg/dL — ABNORMAL LOW (ref 0.61–1.24)
Creatinine, Ser: 0.43 mg/dL — ABNORMAL LOW (ref 0.61–1.24)
Creatinine, Ser: 0.51 mg/dL — ABNORMAL LOW (ref 0.61–1.24)
Creatinine, Ser: 0.47 mg/dL — ABNORMAL LOW (ref 0.61–1.24)
Creatinine, Ser: 0.5 mg/dL — ABNORMAL LOW (ref 0.61–1.24)
Creatinine, Ser: 0.52 mg/dL — ABNORMAL LOW (ref 0.61–1.24)
GFR calc Af Amer: >60 mL/min (ref >60)
GFR calc Af Amer: >60 mL/min (ref >60)
GFR calc Af Amer: >60 mL/min (ref >60)
GFR calc Af Amer: >60 mL/min (ref >60)
GFR calc Af Amer: >60 mL/min (ref >60)
GFR calc non Af Amer: >60 mL/min (ref >60)
GFR calc non Af Amer: >60 mL/min (ref >60)
GFR calc non Af Amer: >60 mL/min (ref >60)
GFR calc non Af Amer: >60 mL/min (ref >60)
GFR calc non Af Amer: >60 mL/min (ref >60)
GFR calc non Af Amer: >60 mL/min (ref >60)
GLUCOSE: 91 mg/dL (ref 65–99)
GLUCOSE: 104 mg/dL — AB (ref 65–99)
GLUCOSE: 139 mg/dL — AB (ref 65–99)
GLUCOSE: 109 mg/dL — AB (ref 65–99)
GLUCOSE: 107 mg/dL — AB (ref 65–99)
Glucose, Bld: 115 mg/dL — ABNORMAL HIGH (ref 65–99)
Glucose, Bld: 100 mg/dL — ABNORMAL HIGH (ref 65–99)
Glucose, Bld: 95 mg/dL (ref 65–99)
Glucose, Bld: 98 mg/dL (ref 65–99)
POTASSIUM: 4.1 mmol/L (ref 3.5–5.1)
POTASSIUM: 4.1 mmol/L (ref 3.5–5.1)
POTASSIUM: 4.3 mmol/L (ref 3.5–5.1)
POTASSIUM: 4.5 mmol/L (ref 3.5–5.1)
POTASSIUM: 4.8 mmol/L (ref 3.5–5.1)
Potassium: 4.5 mmol/L (ref 3.5–5.1)
Potassium: 4.5 mmol/L (ref 3.5–5.1)
Potassium: 4.3 mmol/L (ref 3.5–5.1)
Potassium: 4.4 mmol/L (ref 3.5–5.1)
SODIUM: 113 mmol/L — AB (ref 135–145)
SODIUM: 116 mmol/L — AB (ref 135–145)
Sodium: 111 mmol/L — CL (ref 135–145)
Sodium: 116 mmol/L — CL (ref 135–145)
Sodium: 117 mmol/L — CL (ref 135–145)
Sodium: 118 mmol/L — CL (ref 135–145)
Sodium: 116 mmol/L — CL (ref 135–145)
Sodium: 117 mmol/L — CL (ref 135–145)
Sodium: 120 mmol/L — ABNORMAL LOW (ref 135–145)

## 2015-05-30 LAB — EXPECTORATED SPUTUM ASSESSMENT W GRAM STAIN, RFLX TO RESP C: Special Requests: NORMAL

## 2015-05-30 LAB — GLUCOSE, CAPILLARY
GLUCOSE-CAPILLARY: 116 mg/dL — AB (ref 65–99)
Glucose-Capillary: 110 mg/dL — ABNORMAL HIGH (ref 65–99)
Glucose-Capillary: 95 mg/dL (ref 65–99)

## 2015-05-30 LAB — SODIUM, URINE, RANDOM

## 2015-05-30 LAB — CBC
HEMATOCRIT: 33.4 % — AB (ref 39.0–52.0)
Hemoglobin: 11.6 g/dL — ABNORMAL LOW (ref 13.0–17.0)
MCH: 29.1 pg (ref 26.0–34.0)
MCHC: 34.7 g/dL (ref 30.0–36.0)
MCV: 83.9 fL (ref 78.0–100.0)
PLATELETS: 256 10*3/uL (ref 150–400)
RBC: 3.98 MIL/uL — AB (ref 4.22–5.81)
RDW: 13 % (ref 11.5–15.5)
WBC: 11 10*3/uL — AB (ref 4.0–10.5)

## 2015-05-30 LAB — PHOSPHORUS: PHOSPHORUS: 2.6 mg/dL (ref 2.5–4.6)

## 2015-05-30 LAB — ECHOCARDIOGRAM COMPLETE
HEIGHTINCHES: 69 in
Weight: 2737.23 oz

## 2015-05-30 LAB — TROPONIN I: Troponin I: <0.03 ng/mL (ref <0.031)

## 2015-05-30 LAB — MAGNESIUM: MAGNESIUM: 1.8 mg/dL (ref 1.7–2.4)

## 2015-05-30 LAB — STREP PNEUMONIAE URINARY ANTIGEN: STREP PNEUMO URINARY ANTIGEN: NEGATIVE

## 2015-05-30 LAB — LACTIC ACID, PLASMA: LACTIC ACID, VENOUS: 1.5 mmol/L (ref 0.5–2.0)

## 2015-05-30 LAB — HEPARIN LEVEL (UNFRACTIONATED): HEPARIN UNFRACTIONATED: 0.11 [IU]/mL — AB (ref 0.30–0.70)

## 2015-05-30 LAB — OSMOLALITY: OSMOLALITY: 235 mosm/kg — AB (ref 275–295)

## 2015-05-30 LAB — MRSA PCR SCREENING: MRSA BY PCR: NEGATIVE

## 2015-05-30 LAB — TSH: TSH: 0.483 u[IU]/mL (ref 0.350–4.500)

## 2015-05-30 LAB — CORTISOL: CORTISOL PLASMA: 22 ug/dL

## 2015-05-30 LAB — PROCALCITONIN: Procalcitonin: 0.22 ng/mL

## 2015-05-30 LAB — OSMOLALITY, URINE: Osmolality, Ur: 397 mOsm/kg (ref 300–900)

## 2015-05-30 MED ORDER — MAGNESIUM SULFATE IN D5W 1-5 GM/100ML-% IV SOLN
1.0000 g | Freq: Once | INTRAVENOUS | Status: AC
  Administered 2015-05-30: 1 g via INTRAVENOUS
  Filled 2015-05-30: qty 100

## 2015-05-30 MED ORDER — IPRATROPIUM BROMIDE 0.02 % IN SOLN
0.5000 mg | Freq: Four times a day (QID) | RESPIRATORY_TRACT | Status: DC
  Administered 2015-05-30 – 2015-06-01 (×8): 0.5 mg via RESPIRATORY_TRACT
  Filled 2015-05-30 (×8): qty 2.5

## 2015-05-30 MED ORDER — AMIODARONE HCL IN DEXTROSE 360-4.14 MG/200ML-% IV SOLN
INTRAVENOUS | Status: AC
  Administered 2015-05-30: 60 mg/h
  Filled 2015-05-30: qty 200

## 2015-05-30 MED ORDER — IPRATROPIUM-ALBUTEROL 0.5-2.5 (3) MG/3ML IN SOLN: 3.0000 mL | Freq: Four times a day (QID) | RESPIRATORY_TRACT | Status: DC

## 2015-05-30 MED ORDER — LEVALBUTEROL HCL 0.63 MG/3ML IN NEBU
0.6300 mg | INHALATION_SOLUTION | Freq: Four times a day (QID) | RESPIRATORY_TRACT | Status: DC
  Administered 2015-05-30 – 2015-06-01 (×8): 0.63 mg via RESPIRATORY_TRACT
  Filled 2015-05-30 (×16): qty 3

## 2015-05-30 MED ORDER — SODIUM CHLORIDE 0.9 % IV SOLN
Freq: Once | INTRAVENOUS | Status: AC
  Administered 2015-05-30: 02:00:00 via INTRAVENOUS

## 2015-05-30 MED ORDER — DOXYCYCLINE HYCLATE 100 MG IV SOLR
100.0000 mg | Freq: Two times a day (BID) | INTRAVENOUS | Status: DC
  Administered 2015-05-30 – 2015-06-01 (×5): 100 mg via INTRAVENOUS
  Filled 2015-05-30 (×6): qty 100

## 2015-05-30 MED ORDER — HEPARIN BOLUS VIA INFUSION
2000.0000 [IU] | Freq: Once | INTRAVENOUS | Status: AC
  Administered 2015-05-30: 2000 [IU] via INTRAVENOUS
  Filled 2015-05-30: qty 2000

## 2015-05-30 MED ORDER — AMIODARONE HCL IN DEXTROSE 360-4.14 MG/200ML-% IV SOLN
60.0000 mg/h | INTRAVENOUS | Status: AC
  Administered 2015-05-30 (×2): 60 mg/h via INTRAVENOUS

## 2015-05-30 MED ORDER — AMIODARONE HCL IN DEXTROSE 360-4.14 MG/200ML-% IV SOLN
30.0000 mg/h | INTRAVENOUS | Status: DC
  Administered 2015-05-31 – 2015-06-01 (×3): 30 mg/h via INTRAVENOUS
  Filled 2015-05-30 (×9): qty 200

## 2015-05-30 MED ORDER — DEXMEDETOMIDINE HCL IN NACL 400 MCG/100ML IV SOLN
0.4000 ug/kg/h | INTRAVENOUS | Status: DC
  Administered 2015-05-30: 0.4 ug/kg/h via INTRAVENOUS
  Administered 2015-05-30: 1.2 ug/kg/h via INTRAVENOUS
  Administered 2015-05-30: 0.8 ug/kg/h via INTRAVENOUS
  Administered 2015-05-31: 1.3 ug/kg/h via INTRAVENOUS
  Administered 2015-05-31: 0.6 ug/kg/h via INTRAVENOUS
  Administered 2015-05-31: 0.9 ug/kg/h via INTRAVENOUS
  Administered 2015-05-31: 1.4 ug/kg/h via INTRAVENOUS
  Administered 2015-06-01: 1.5 ug/kg/h via INTRAVENOUS
  Filled 2015-05-30 (×3): qty 100
  Filled 2015-05-30: qty 50
  Filled 2015-05-30 (×5): qty 100

## 2015-05-30 MED ORDER — SODIUM CHLORIDE 0.9 % IV SOLN
INTRAVENOUS | Status: DC
  Administered 2015-05-30 – 2015-05-31 (×4): via INTRAVENOUS

## 2015-05-30 MED ORDER — WHITE PETROLATUM GEL
Status: AC
  Administered 2015-05-30: 0.2
  Filled 2015-05-30: qty 1

## 2015-05-30 NOTE — Progress Notes (Signed)
CRITICAL VALUE ALERT  Critical value received:  Na 111  Date of notification:  05/30/15  Time of notification:  0150  Critical value read back:Yes.    Nurse who received alert:  Remonia RichterLindsey Walsh RN  MD notified (1st page):  Rutherford Guysahul Desai NP  Time of first page:  [phone] (in person)

## 2015-05-30 NOTE — Progress Notes (Signed)
  Echocardiogram 2D Echocardiogram has been performed.  Leta JunglingCooper, Sharlena Kristensen M 05/30/2015, 1:17 PM

## 2015-05-30 NOTE — Significant Event (Signed)
CRITICAL VALUE ALERT  Critical value received:  Sodium 116  Date of notification:  05/30/15  Time of notification:0802  Critical value read back: yes  Nurse who received alert:  Layne BentonJulian Beabraut  MD notified:  Dr Jamison NeighborNestor   AVWU:[id number]:0803

## 2015-05-30 NOTE — Care Management Note (Addendum)
Case Management Note  Patient Details  Name: Hulen ShoutsWinford A Kydd MRN: [id number] Date of Birth: June 19, 1950  Subjective/Objective:     Pt admitted with CAP and resp distress - on BIPAP               Action/Plan:  [facility] home[facility]  CM attempted to contact sister via phone; unable to leave voicemail.  CM will continue to monitor for disposition needs   Expected Discharge Date:                  Expected Discharge Plan:  Home w Home Health Services  In-House Referral:     Discharge planning Services  CM Consult  Post Acute Care Choice:    Choice offered to:     DME Arranged:    DME Agency:     HH Arranged:    HH Agency:     Status of Service:  In process, will continue to follow  Medicare Important Message Given:    Date Medicare IM Given:    Medicare IM give by:    Date Additional Medicare IM Given:    Additional Medicare Important Message give by:     If discussed at Long Length of Stay Meetings, dates discussed:    Additional Comments:  Cherylann ParrClaxton, Rhonda Vangieson S, RN 05/30/2015, 9:37 AM

## 2015-05-30 NOTE — Significant Event (Signed)
CRITICAL VALUE ALERT  Critical value received: Sodium 116  Date of notification: 05/30/15  Time of notification: 1447  Critical value read back: yes  Nurse who received alert: Burnard BuntingKatrice Anuar Walgren, RN  MD notified:  Dr Jamison NeighborNestor   Time:1500

## 2015-05-30 NOTE — Care Management Important Message (Signed)
Important Message  Patient Details  Name: Ray Green MRN: [id number] Date of Birth: Sep 15, 1950   Medicare Important Message Given:  Yes    Thompson Mckim Abena 05/30/2015, 1:21 PM

## 2015-05-30 NOTE — Progress Notes (Signed)
CRITICAL VALUE ALERT  Critical value received:  Na 116  Date of notification:  05/30/15  Time of notification:  0801  Critical value read back:Yes.    Nurse who received alert:  Layne BentonJulian Kanitra Purifoy, RN   MD notified (1st page):  Dr. Jamison NeighborNestor  Time of first page:  0801  MD notified (2nd page):  Time of second page:  Responding MD:  Dr. Jamison NeighborNestor  Time MD responded:  [phone]

## 2015-05-30 NOTE — Progress Notes (Signed)
ANTICOAGULATION CONSULT NOTE  Pharmacy Consult for heparin Indication: atrial fibrillation  No Known Allergies  Patient Measurements: Height: 5\' 9"  (175.3 cm) Weight: 167 lb 12.3 oz (76.1 kg) IBW/kg (Calculated) : 70.7 Heparin Dosing Weight: 76 kg  Vital Signs: Temp: 98.3 F (36.8 C) (05/26 0351) Temp Source: Oral (05/26 0351) BP: 85/56 mmHg (05/26 0500) Pulse Rate: 100 (05/26 0400)  Labs:  Recent Labs  05/18/2015 1620  05/30/15 0025 05/30/15 0243 05/30/15 0440 05/30/15 0443  HGB 14.8  --   --   --  11.6*  --   HCT 43.4  --   --   --  33.4*  --   PLT 286  --   --   --  256  --   LABPROT 16.8*  --   --   --   --   --   INR 1.35  --   --   --   --   --   HEPARINUNFRC  --   --   --   --   --  0.11*  CREATININE 0.59*  < > 0.49* 0.53* 0.54*  --   < > = values in this interval not displayed.  Estimated Creatinine Clearance: 90.8 mL/min (by C-G formula based on Cr of 0.54).   Medical History: Past Medical History  Diagnosis Date  . Hyponatremia   . Anemia   . Incontinence, feces   . Lumbar back pain   . Alcohol abuse   . HH (hiatus hernia)   . Chronic bronchitis   . TIA (transient ischemic attack)   . Osteoporosis   . Seizure disorder (HCC)   . Hypertension   . GERD (gastroesophageal reflux disease)   . COPD (chronic obstructive pulmonary disease) (HCC)   . DDD (degenerative disc disease), cervical   . RLS (restless legs syndrome)   . Seizures (HCC)     Medications:  Scheduled:  . antiseptic oral rinse  7 mL Mouth Rinse q12n4p  . arformoterol  15 mcg Nebulization BID  . azithromycin  500 mg Intravenous Q24H  . budesonide (PULMICORT) nebulizer solution  0.5 mg Nebulization BID  . chlorhexidine  15 mL Mouth Rinse BID  . clopidogrel  75 mg Oral Daily  . ipratropium  0.5 mg Nebulization Q4H  . pantoprazole (PROTONIX) IV  40 mg Intravenous Q24H  . phenytoin (DILANTIN) IV  165 mg Intravenous Q8H  . piperacillin-tazobactam (ZOSYN)  IV  3.375 g Intravenous Q8H   . vancomycin  1,000 mg Intravenous Q12H    Assessment: 65 yo man admitted 05/20/2015 with severe respiratory distress & concern for pna, found to be in AFib with RVR. CHADSVASc at least 4. Pharmacy consulted to dose heparin.  No anticoagulation prior to admission. Hgb 14.8, plt wnl.   Initial HL is subtherapeutic at 0.11 on heparin 1050 units/hr. Nurse reports no issues with infusion or bleeding.  Goal of Therapy:  Heparin level 0.3-0.7 units/ml Monitor platelets by anticoagulation protocol: Yes   Plan:  Heparin 2000 unit bolus and increase rate to 1350 units/hr 6h HL Daily HL, CBC Monitor s/sx bleeding  Arlean Hoppingorey M. Newman PiesBall, PharmD, BCPS Clinical Pharmacist Pager [phone]  05/30/2015,5:23 AM

## 2015-05-30 NOTE — Progress Notes (Signed)
eLink Physician-Brief Progress Note Patient Name: Ray Green DOB: [birth year] MRN: [id number]   Date of Service  05/30/2015  HPI/Events of Note  Hyponatremia corrected to 113 from 109 over past 12 hours.  Currently with 3% at 60 cc/hr  eICU Interventions  Plan: Reduce 3% rate to 30 cc/hr Continue to monitor NA     Intervention Category Major Interventions: Electrolyte abnormality - evaluation and management  Tu Shimmel 05/30/2015, 3:34 AM

## 2015-05-30 NOTE — Progress Notes (Addendum)
PULMONARY / CRITICAL CARE MEDICINE   Name: Ray Green MRN: [id number] DOB: [birth year]    ADMISSION DATE:  05/13/2015 CONSULTATION DATE:  05/28/2015  REFERRING MD:  EDP  CHIEF COMPLAINT:  AMS, SOB  HISTORY OF PRESENT ILLNESS:  Pt is encephelopathic; therefore, this HPI is obtained from chart review. Ray Green is a 65 y.o. male with PMH as outlined below including COPD, previously seen by Dr. Sherene Sires and who continues to smoke.  He had SOB the evening of 05/24 and EMS was dispatched; however, he refused to come to the hospital and felt that symptoms would resolve. The following day, 5/25, his home aid (who helps him with some daily chores) noted that he was more somnolent than usual and was "in and out of it".  Later that afternoon, he developed increased WOB and complained of SOB again.  His SOB worsened to the point that he could barely speak. On EMS arrival, he had SpO2 of 85% on RA which improved after he was placed on NRB. In ED, he remained somnolent and had mild increased WOB; therefore, he was placed on BiPAP.  CXR revealed diffuse right sided infiltrate and lab work showed multiple metabolic derangements including Na 110. Daughter states pt is stubborn and does NOT want to go to ED. The reason he was brought in today was because he was out and obtunded. Per daughter, pt has NOT been eating for several days, not sure if he has been taking his meds.   SUBJECTIVE:  Patient remained on BiPAP until approximately 8am this morning. Denies any chest pain or pressure. He feels like his breathing is worse on BiPAP. Reports intermittent, nonproductive cough.  REVIEW OF SYSTEMS:  Denies any nausea or emesis. No fever or chills.  VITAL SIGNS: BP 110/78 mmHg  Pulse 143  Temp(Src) 98.4 F (36.9 C) (Oral)  Resp 37  Ht  (1.753 m)  Wt 171 lb 1.2 oz (77.6 kg)  BMI 25.25 kg/m2  SpO2 96%  HEMODYNAMICS: CVP:  [8 mmHg-10 mmHg] 10 mmHg  VENTILATOR SETTINGS: Vent Mode:  [-] BIPAP FiO2  (%):  [50 %] 50 % Set Rate:  [15 bmp] 15 bmp PEEP:  [5 cmH20] 5 cmH20  INTAKE / OUTPUT: I/O last 3 completed shifts: In: 1510.5 [I.V.:1028.9; IV Piggyback:481.6] Out: 750 [Urine:750]     PHYSICAL EXAMINATION: General: Elderly male. No distress. Awake. Neuro: Oriented to year, season, president & location. Following commands. HEENT: BiPAP mask in place. No scleral icterus.  Cardiovascular: Tachycardic. Irregular rhythm. A fib on telemetry. No edema. Lungs: Diminished right breath sounds. Normal work of breathing on BiPAP.  Abdomen: Soft. Nontender. Nondistended. Integument:  Warm & dry. No rash on exposed skin.  LABS:  BMET  Recent Labs Lab 05/30/15 0243 05/30/15 0440 05/30/15 0640  NA 113* 116* 116*  K 4.5 4.8 4.5  CL 79* 81* 82*  CO2 BUN CREATININE 0.53* 0.54* 0.43*  GLUCOSE 100* 95 91    Electrolytes  Recent Labs Lab 05/30/15 0243 05/30/15 0440 05/30/15 0640  CALCIUM 7.9* 8.0* 8.0*  MG  --  1.8  --   PHOS  --  2.6  --     CBC  Recent Labs Lab 05/12/2015 1620 05/30/15 0440  WBC 16.5* 11.0*  HGB 14.8 11.6*  HCT 43.4 33.4*  PLT 286 256    Coag's  Recent Labs Lab 05/31/2015 1620  INR 1.35    Sepsis Markers  Recent Labs Lab  05/24/2015 1638 05/15/2015 2045 05/17/2015 2151 05/30/15 0005 05/30/15 0440  LATICACIDVEN 3.92* 2.30*  --  1.5  --   PROCALCITON  --   --  0.19  --  0.22    ABG  Recent Labs Lab 06/04/2015 1553  PHART 7.320*  PCO2ART 50.9*  PO2ART 165.0*    Liver Enzymes  Recent Labs Lab 05/07/2015 1620  AST 49*  ALT 28  ALKPHOS 177*  BILITOT 1.6*  ALBUMIN 2.9*    Cardiac Enzymes No results for input(s): TROPONINI, PROBNP in the last 168 hours.  Glucose  Recent Labs Lab 05/19/2015 1656 05/31/2015 2339  GLUCAP 105* 110*    Imaging Ct Head Wo Contrast  05/31/2015  CLINICAL DATA:  Altered mental status and possible seizure EXAM: CT HEAD WITHOUT CONTRAST TECHNIQUE: Contiguous axial images were  obtained from the base of the skull through the vertex without intravenous contrast. COMPARISON:  04/23/2013 FINDINGS: Diffuse cortical atrophy with encephalomalacia left parietal lobe related to prior trauma. This is stable. Periventricular white matter low attenuation is stable as well. Craniotomy defect left posterior parietal lobe again identified. However, there is now a soft tissue mass associated with the craniotomy defect measuring 5 cm in diameter by a 2.5 cm. Involves both the inner and outer table of adjacent bone and causes thinning of the calvarium surrounding the craniotomy site. There is also a lytic defect in the posterior central parietal bone measuring 14 mm. IMPRESSION: 1. No acute intracranial abnormalities.  Evidence prior trauma. 2. Soft tissue mass in the scalp in the area of craniotomy defect associated with both the inner and outer table. Widespread abnormal lucency throughout the posterior left parietal bone and central parietal bone. This is new from prior study. Differential diagnostic possibilities include malignant soft tissue mass with osseous involvement, as well as metastatic disease. Recommend contrast-enhanced MRI to better characterize. Electronically Signed   By: Esperanza Heiraymond  Rubner M.D.   On: 05/13/2015 21:03   Dg Chest Port 1 View  05/06/2015  CLINICAL DATA:  Central line placement.  Initial encounter. EXAM: PORTABLE CHEST 1 VIEW COMPARISON:  Chest radiograph performed earlier today at 11:16 p.m. FINDINGS: The patient's left IJ line is noted ending about the mid SVC. A moderate right-sided pleural effusion is noted. Diffuse right-sided airspace opacification raises concern for diffuse pneumonia. Underlying vascular congestion is noted. No pneumothorax is seen. The cardiomediastinal silhouette is enlarged. No acute osseous abnormalities identified. IMPRESSION: 1. Left IJ line noted ending about the mid SVC. 2. Moderate right-sided pleural effusion noted. Persistent diffuse  right-sided airspace opacification raises concern for pneumonia. 3. Underlying vascular congestion and cardiomegaly. Electronically Signed   By: Roanna RaiderJeffery  Chang M.D.   On: 05/17/2015 23:47   Dg Chest Port 1 View  06/03/2015  CLINICAL DATA:  Central line placement. EXAM: PORTABLE CHEST 1 VIEW COMPARISON:  05/13/2015 at 15:52 p.m. and 09/06/2014 FINDINGS: Patient is rotated to the right. There has been interval placement of a left IJ central venous catheter which courses into the region of the superior vena cava and then turns superiorly with tip likely within the SVC just below the head of the right clavicle. No evidence of left-sided pneumothorax. Interval worsening near complete opacification of the right lung likely combination of airspace disease and effusion. Diffuse interstitial prominence within the left lung. Stable cardiomegaly. Remainder of the exam is unchanged. IMPRESSION: Interval worsening near complete opacification of the right lung likely combination of airspace disease and right effusion. Diffuse interstitial prominence within the left lung. Left IJ  central venous catheter which courses into the region of the SVC and turn superiorly with tip likely within the SVC just below the head of the right clavicle. No pneumothorax. These results were called by telephone at the time of interpretation on 2015/06/15 at 11:31 pm to patient's nurse, Chiquita Loth, who verbally acknowledged these results. Electronically Signed   By: Elberta Fortis M.D.   On: 06-15-2015 23:31   Dg Chest Portable 1 View  06-15-2015  CLINICAL DATA:  Respiratory distress EXAM: PORTABLE CHEST 1 VIEW COMPARISON:  09/06/2014 FINDINGS: Cardiac shadow is stable. Diffuse infiltrate is noted throughout the right lung as well as some pleural thickening and pleural effusion. These changes are new from the prior exam. The left lung remains clear. No bony abnormality is noted. IMPRESSION: Diffuse infiltrative change in the right lung with  pleural thickening and pleural effusion. CT may be helpful for further evaluation. Electronically Signed   By: Alcide Clever M.D.   On: 06/15/15 16:10     STUDIES:  CXR 05/25 > diffuse right sided infiltrate with ? Pleural thickening and pleural effusion. CT head 05/25 > no acute process.  Widespread abnormal lucency throughout the posterior left parietal lobe and central parietal bone, DDx includes malignant soft tissue mass with osseous involvement and metastatic disease.   Port CXR 5/25:  Right lung opacification. Left CVL w/ tip in SVC. MRI brain 05/26 > TTE 05/26 >  MICROBIOLOGY: Blood 05/25 > Urine 05/25 > MRSA PCR 5/25:  Negative  Urine Strep Ag 5/25: Negative Urine Legionella Ag 5/25 >  ANTIBIOTICS: Vanc 5/25 - 5/26 Zosyn 5/25 - 5/26 Doxycycline 5/26 >>  SIGNIFICANT EVENTS: 05/25 > admitted with acute hypoxic respiratory failure, CAP, AMS, hyponatremia.  LINES/TUBES: L IJ CVL 5/25 >> Foley 5/25 >> PIV x2  ASSESSMENT / PLAN:  RENAL A:   Hyponatremia - AMS (110). Pseudohypocalcemia - corrects to 9.18.  P:   Stopping 3% HS Starting NS at 200cc/hr IV Trending electrolytes q4hr Monitoring UOP Ionized Calcium pending  NEUROLOGIC A:   Acute Encephalopathy - Resolved. Multifactorial. Likely due to hypoxia, hypercarbia, & hyponatremia. Soft Tissue Mass on Head CT H/O TIA, seizures, DDD, RLS  P:   Holding on MRI given waxing & waning mentation Starting Precedex gtt Continue outpatient plavix, phenytoin. Hold outpatient diazepam, morphine, pregabalin, quetiapine, ropinirole.  PULMONARY A: Acute Hypoxic Respiratory Failure Acute Hypercarbic Respiratory Failure Possible Right CAP Possible Right Effusion H/O COPD H/O Tobacco Use  P:   BiPAP  Xopenex q6hr Atrovent q6hr Tobacco cessation counseling if patient improves Checking thoracic ultrasound for possible thoracentesis   CARDIOVASCULAR A:  A.fib with RVR - New onset. H/O HTN.  P:  Monitor on  telemetry Vitals per unit protocol Continue Heparin gtt per pharmacy protocol Continue outpatient plavix. Hold outpatient furosemide, losartan. TTE pending Trending Troponin I Starting Amiodarone gtt given borderline hypotension  INFECTIOUS A:   Possible Sepsis Possible Right CAP  P:   Switching to Empiric Doxycycline Awaiting culture results PCT per algorithm  GASTROINTESTINAL A:   H/O GERD  P:   NPO Protonix IV daily  HEMATOLOGIC A:   No acute issues.  P:  Heparin gtt. Trending cell counts daily w/ CBC  ENDOCRINE A:   No acute issues.  P:   Monitor blood glucose electrolyte panel.  Family Updates: Daughter, sister, father, and brother updated 5/26 by Dr. Jamison Neighbor.  Interdisciplinary Family Meeting v Palliative Care Meeting:  Due by: 06/04/15.  TODAY'S SUMMARY:  65 y.o. M admitted 05/25  with acute hypoxic respiratory failure, CAP, AMS, hyponatremia. I'm doubtful that this represents an infectious process. Suspect the patient has underlying malignancy contributing to findings on his chest x-ray. Thoracentesis may be somewhat difficult with outpatient cooperation. This was communicated to the patient's family as well as the risks of bleeding, infection, and pneumothorax. Continuing close monitoring in the intensive care unit. High potential for further clinical decompensation and family were made aware of this.  I have spent a total of 41 minutes of critical care time today caring for the patient, updating the patient's family at bedside, and reviewing the patient's electronic medical record.  Donna Christen Jamison Neighbor, M.D. [facility] Pager:  [phone] After 3pm or if no response, call [phone] 10:49 AM 05/30/2015

## 2015-05-30 NOTE — Progress Notes (Signed)
Initial Nutrition Assessment  DOCUMENTATION CODES:   Not applicable  INTERVENTION:    Monitor for diet advancement and add PO supplements if intake is inadequate.  NUTRITION DIAGNOSIS:   Inadequate oral intake related to inability to eat as evidenced by NPO status.  GOAL:   Patient will meet greater than or equal to 90% of their needs  MONITOR:   Diet advancement, PO intake, Labs, Weight trends, I & O's, Skin  REASON FOR ASSESSMENT:   Low Braden    ASSESSMENT:   65 y.o. male with PMH including COPD, and who continues to smoke. He had SOB the evening of 05/24 and EMS was dispatched; however, he refused to come to the hospital and felt that symptoms would resolve. The following day, 5/25, in the afternoon, he developed increased WOB and complained of SOB again. His SOB worsened to the point that he could barely speak. EMS brought patient to the ED.   Patient reports that he has been eating poorly. He is currently on BiPAP, remains NPO. Nutrition-Focused physical exam completed. Findings are no fat depletion, mild-moderate muscle depletion, and no edema.   Diet Order:  Diet NPO time specified  Skin:  Reviewed, no issues  Last BM:  unknown  Height:   Ht Readings from Last 1 Encounters:  05/11/2015 5\' 9"  (1.753 m)    Weight:   Wt Readings from Last 1 Encounters:  05/30/15 171 lb 1.2 oz (77.6 kg)    Ideal Body Weight:  72.7 kg  BMI:  Body mass index is 25.25 kg/(m^2).  Estimated Nutritional Needs:   Kcal:  1850-2050  Protein:  90-110 gm  Fluid:  2 L  EDUCATION NEEDS:   No education needs identified at this time  Joaquin CourtsKimberly Harris, RD, LDN, CNSC Pager [phone] After Hours Pager [phone]

## 2015-05-30 NOTE — Progress Notes (Signed)
eLink Physician-Brief Progress Note Patient Name: Ray ShoutsWinford A Green DOB: 1950/01/21 MRN: [id number]   Date of Service  05/30/2015  HPI/Events of Note  Ongoing hypotension in the setting of PNA, AF/RVR on dilt gtt, and hyponatremia on 3% saline.  Current BP of 84/52 (62)  eICU Interventions  Plan: Stop dilt gtt Check CVP Decide on fluids/pressors based on CVP reading     Intervention Category Intermediate Interventions: Arrhythmia - evaluation and management;Hypotension - evaluation and management  Caeleb Batalla 05/30/2015, 12:46 AM

## 2015-05-30 NOTE — Progress Notes (Signed)
CRITICAL VALUE ALERT  Critical value received:  Na 116  Date of notification:  05/30/15  Time of notification:  0520  Critical value read back:Yes.    Nurse who received alert:  Remonia RichterLindsey Walsh RN  MD notified (1st page):  eLink MD  Time of first page:  0522  Responding MD:  Darrick Pennaeterding, MD  Time MD responded:  [phone]

## 2015-05-30 NOTE — Progress Notes (Signed)
CRITICAL VALUE ALERT  Critical value received:  Na 113  Date of notification:  05/30/15  Time of notification:  0320  Critical value read back:Yes.    Nurse who received alert:  Dan MakerJames Dunlop RN  MD notified (1st page):  eLink MD  Time of first page:  0320  Responding MD:  Deterding, MD  Time MD responded:  0330- 3% saline decreased per orders

## 2015-05-30 NOTE — Significant Event (Signed)
CRITICAL VALUE ALERT  Critical value received: Sodium 117  Date of notification:  05/30/15  Time of notification: 1044  Critical value read back: yes  Nurse who received alert: Reece PackerKristina Kelly  MD notified: Dr Jamison NeighborNestor  Time:  [id number]

## 2015-05-30 NOTE — Plan of Care (Signed)
PCCM Plan of care  Evaluated patient for thoracentesis.  Under ultrasound visualization, small right sided loculated pleural effusion with mostly consolidated lung.  Patient is floridly delirious and agitated on 1.112mcg/kg/hr, pocket is amenable for thoracentesis but patient too agitated.  Not safe to perform as patient will not/can not remain still.  Sating 97% on Bipap, tolerating bipap well.  Plan: Risks of thoracentesis outweigh benefits at this time.  Not performed.   Galvin Profferaniel Milayna Rotenberg, DO., MS [facility]

## 2015-05-31 LAB — BASIC METABOLIC PANEL
Anion gap: 8 (ref 5–15)
Anion gap: 11 (ref 5–15)
Anion gap: 10 (ref 5–15)
Anion gap: 7 (ref 5–15)
BUN: 5 mg/dL — ABNORMAL LOW (ref 6–20)
BUN: 5 mg/dL — ABNORMAL LOW (ref 6–20)
BUN: 6 mg/dL (ref 6–20)
BUN: 5 mg/dL — ABNORMAL LOW (ref 6–20)
CALCIUM: 8.1 mg/dL — AB (ref 8.9–10.3)
CALCIUM: 7.7 mg/dL — AB (ref 8.9–10.3)
CALCIUM: 7.8 mg/dL — AB (ref 8.9–10.3)
CALCIUM: 8.2 mg/dL — AB (ref 8.9–10.3)
CO2: 25 mmol/L (ref 22–32)
CO2: 24 mmol/L (ref 22–32)
CO2: 23 mmol/L (ref 22–32)
CO2: 25 mmol/L (ref 22–32)
CREATININE: 0.43 mg/dL — AB (ref 0.61–1.24)
CREATININE: 0.43 mg/dL — AB (ref 0.61–1.24)
CREATININE: 0.52 mg/dL — AB (ref 0.61–1.24)
CREATININE: 0.46 mg/dL — AB (ref 0.61–1.24)
Chloride: 86 mmol/L — ABNORMAL LOW (ref 101–111)
Chloride: 88 mmol/L — ABNORMAL LOW (ref 101–111)
Chloride: 88 mmol/L — ABNORMAL LOW (ref 101–111)
Chloride: 90 mmol/L — ABNORMAL LOW (ref 101–111)
GFR calc non Af Amer: >60 mL/min (ref >60)
GFR calc non Af Amer: >60 mL/min (ref >60)
GFR calc non Af Amer: >60 mL/min (ref >60)
GFR calc non Af Amer: >60 mL/min (ref >60)
GLUCOSE: 125 mg/dL — AB (ref 65–99)
Glucose, Bld: 111 mg/dL — ABNORMAL HIGH (ref 65–99)
Glucose, Bld: 112 mg/dL — ABNORMAL HIGH (ref 65–99)
Glucose, Bld: 109 mg/dL — ABNORMAL HIGH (ref 65–99)
Potassium: 4.2 mmol/L (ref 3.5–5.1)
Potassium: 4 mmol/L (ref 3.5–5.1)
Potassium: 4.1 mmol/L (ref 3.5–5.1)
Potassium: 4.1 mmol/L (ref 3.5–5.1)
SODIUM: 123 mmol/L — AB (ref 135–145)
SODIUM: 121 mmol/L — AB (ref 135–145)
SODIUM: 122 mmol/L — AB (ref 135–145)
Sodium: 119 mmol/L — CL (ref 135–145)

## 2015-05-31 LAB — RENAL FUNCTION PANEL
ALBUMIN: 2.1 g/dL — AB (ref 3.5–5.0)
ANION GAP: 7 (ref 5–15)
BUN: <5 mg/dL — ABNORMAL LOW (ref 6–20)
CALCIUM: 8 mg/dL — AB (ref 8.9–10.3)
CO2: 26 mmol/L (ref 22–32)
Chloride: 88 mmol/L — ABNORMAL LOW (ref 101–111)
Creatinine, Ser: 0.5 mg/dL — ABNORMAL LOW (ref 0.61–1.24)
GFR calc non Af Amer: >60 mL/min (ref >60)
Glucose, Bld: 117 mg/dL — ABNORMAL HIGH (ref 65–99)
PHOSPHORUS: 1.8 mg/dL — AB (ref 2.5–4.6)
Potassium: 4.1 mmol/L (ref 3.5–5.1)
SODIUM: 121 mmol/L — AB (ref 135–145)

## 2015-05-31 LAB — CBC
HCT: 32.2 % — ABNORMAL LOW (ref 39.0–52.0)
Hemoglobin: 10.9 g/dL — ABNORMAL LOW (ref 13.0–17.0)
MCH: 28.9 pg (ref 26.0–34.0)
MCHC: 33.9 g/dL (ref 30.0–36.0)
MCV: 85.4 fL (ref 78.0–100.0)
PLATELETS: 198 10*3/uL (ref 150–400)
RBC: 3.77 MIL/uL — ABNORMAL LOW (ref 4.22–5.81)
RDW: 13.4 % (ref 11.5–15.5)
WBC: 12.1 10*3/uL — ABNORMAL HIGH (ref 4.0–10.5)

## 2015-05-31 LAB — HEPARIN LEVEL (UNFRACTIONATED)
HEPARIN UNFRACTIONATED: 0.23 [IU]/mL — AB (ref 0.30–0.70)
Heparin Unfractionated: 0.15 IU/mL — ABNORMAL LOW (ref 0.30–0.70)

## 2015-05-31 LAB — URINE CULTURE
Culture: NO GROWTH
SPECIAL REQUESTS: NORMAL

## 2015-05-31 LAB — LEGIONELLA PNEUMOPHILA SEROGP 1 UR AG: L. pneumophila Serogp 1 Ur Ag: NEGATIVE

## 2015-05-31 LAB — PROCALCITONIN: Procalcitonin: 0.12 ng/mL

## 2015-05-31 LAB — MAGNESIUM: Magnesium: 1.8 mg/dL (ref 1.7–2.4)

## 2015-05-31 LAB — CALCIUM, IONIZED: CALCIUM, IONIZED, SERUM: 4.6 mg/dL (ref 4.5–5.6)

## 2015-05-31 MED ORDER — HEPARIN BOLUS VIA INFUSION
1100.0000 [IU] | Freq: Once | INTRAVENOUS | Status: AC
  Administered 2015-05-31: 1100 [IU] via INTRAVENOUS
  Filled 2015-05-31: qty 1100

## 2015-05-31 NOTE — Progress Notes (Signed)
ANTICOAGULATION CONSULT NOTE Pharmacy Consult for heparin Indication: atrial fibrillation  No Known Allergies  Patient Measurements: Height: 5\' 9"  (175.3 cm) Weight: 173 lb 1 oz (78.5 kg) IBW/kg (Calculated) : 70.7 Heparin Dosing Weight: 76 kg  Vital Signs: Temp: 97.5 F (36.4 C) (05/27 0000) Temp Source: Axillary (05/27 0000) BP: 84/69 mmHg (05/27 0500) Pulse Rate: 133 (05/27 0500)  Labs:  Recent Labs  2015-06-17 1620  05/30/15 0440 05/30/15 0443  05/30/15 1140  05/30/15 1649  05/30/15 2200 05/31/15 0150 05/31/15 0455 05/31/15 0509  HGB 14.8  --  11.6*  --   --   --   --   --   --   --   --   --  10.9*  HCT 43.4  --  33.4*  --   --   --   --   --   --   --   --   --  32.2*  PLT 286  --  256  --   --   --   --   --   --   --   --   --  198  LABPROT 16.8*  --   --   --   --   --   --   --   --   --   --   --   --   INR 1.35  --   --   --   --   --   --   --   --   --   --   --   --   HEPARINUNFRC  --   --   --  0.11*  --   --   --   --   --   --   --  0.23*  --   CREATININE 0.59*  < > 0.54*  --   < > 0.51*  < >  --   < > 0.52* 0.43* 0.50*  --   TROPONINI  --   --   --   --   --  <0.03  --  <0.03  --  <0.03  --   --   --   < > = values in this interval not displayed.  Estimated Creatinine Clearance: 90.8 mL/min (by C-G formula based on Cr of 0.5).   Assessment: 65 yo male with Afib for heparin  Goal of Therapy:  Heparin level 0.3-0.7 units/ml Monitor platelets by anticoagulation protocol: Yes   Plan:  Increase Heparin 1450 units/hr Check heparin level in 8 hours.  Geannie RisenGreg Natonya Finstad, PharmD, BCPS   05/31/2015,5:48 AM

## 2015-05-31 NOTE — Progress Notes (Signed)
CRITICAL VALUE ALERT  Critical value received:  Na 119  Date of notification:  05/31/15   Time of notification:  0226  Critical value read back:Yes.    Nurse who received alert:  Remonia RichterLindsey Walsh RN  MD notified (1st page):  eLink MD  Time of first page:  0227  Responding MD:  Pola CorneLink MD  Time MD responded:  [phone]

## 2015-05-31 NOTE — Progress Notes (Signed)
ANTICOAGULATION CONSULT NOTE  Pharmacy Consult for heparin Indication: atrial fibrillation  No Known Allergies  Patient Measurements: Height: 5\' 9"  (175.3 cm) Weight: 173 lb 1 oz (78.5 kg) IBW/kg (Calculated) : 70.7 Heparin Dosing Weight: 76 kg  Vital Signs: Temp: 97.2 F (36.2 C) (05/27 1232) Temp Source: Axillary (05/27 1232) BP: 72/51 mmHg (05/27 1400) Pulse Rate: 122 (05/27 1400)  Labs:  Recent Labs  05/31/2015 1620  05/30/15 0440 05/30/15 0443  05/30/15 1140  05/30/15 1649  05/30/15 2200 05/31/15 0150 05/31/15 0455 05/31/15 0509 05/31/15 1000 05/31/15 1518  HGB 14.8  --  11.6*  --   --   --   --   --   --   --   --   --  10.9*  --   --   HCT 43.4  --  33.4*  --   --   --   --   --   --   --   --   --  32.2*  --   --   PLT 286  --  256  --   --   --   --   --   --   --   --   --  198  --   --   LABPROT 16.8*  --   --   --   --   --   --   --   --   --   --   --   --   --   --   INR 1.35  --   --   --   --   --   --   --   --   --   --   --   --   --   --   HEPARINUNFRC  --   --   --  0.11*  --   --   --   --   --   --   --  0.23*  --   --  0.15*  CREATININE 0.59*  < > 0.54*  --   < > 0.51*  < >  --   < > 0.52* 0.43* 0.50*  --  0.43*  --   TROPONINI  --   --   --   --   --  <0.03  --  <0.03  --  <0.03  --   --   --   --   --   < > = values in this interval not displayed.  Estimated Creatinine Clearance: 90.8 mL/min (by C-G formula based on Cr of 0.43).  Assessment: 89 yoM admitted with severe respiratory distress and  concern for PNA.   PMHx: COPD, TIA, Seizures, HTN, OA  Anticoagulation: heparin for AFib (CHADSVASc at least 4) On heparin at 1450 units/hr, HL = 0.15  Cardiovascular: In afib with some low bp - currently not on pressors - amio gtt added  Heme: H&H 10.9/32.2, Plt 198  Goal of Therapy:  Heparin level 0.3-0.7 units/ml Monitor platelets by anticoagulation protocol: Yes   Plan:  Give 1100 unit bolus x 1 then increase drip to 1600  units/hr 2300 HL Daily CBC, HL  Isaac BlissMichael Cache Bills, PharmD, BCPS, [facility] Clinical Pharmacist Pager [phone] 05/31/2015 3:54 PM

## 2015-05-31 NOTE — Progress Notes (Signed)
eLink Physician-Brief Progress Note Patient Name: Ray Green DOB: [birth year] MRN: [id number]   Date of Service  05/31/2015  HPI/Events of Note  Agitation - Already on Precedex IV infusion at 1.2 mcg/kg/hour. Patient on BiPAP and is DNI.  eICU Interventions  Will increase the ceiling on the Precedex IV infusion to 1.5 mcg/kg/hour.     Intervention Category Minor Interventions: Agitation / anxiety - evaluation and management  Sommer,Steven Eugene 05/31/2015, 1:29 AM

## 2015-05-31 NOTE — Progress Notes (Signed)
Attempted to take patient off of bipap and placed on 4L nasal cannula.  Once patient was taken off of bipap, patient sats maintained, however patient began to sound rhonchus and had a weak cough and was unable to cough up any secretions.  Patient began to start using accessory muscles and began having audible wheezing.  Patient was placed back on bipap and breathing improved.

## 2015-05-31 NOTE — Progress Notes (Signed)
PULMONARY / CRITICAL CARE MEDICINE   Name: Ray Green MRN: [id number] DOB: [birth year]    ADMISSION DATE:  06/04/2015 CONSULTATION DATE:  05/05/2015  REFERRING MD:  EDP  CHIEF COMPLAINT:  AMS, SOB  HISTORY OF PRESENT ILLNESS:  Pt is encephelopathic; therefore, this HPI is obtained from chart review. Ray Green is a 65 y.o. male with PMH as outlined below including COPD, previously seen by Dr. Sherene SiresWert and who continues to smoke.  He had SOB the evening of 05/24 and EMS was dispatched; however, he refused to come to the hospital and felt that symptoms would resolve. The following day, 5/25, his home aid (who helps him with some daily chores) noted that he was more somnolent than usual and was "in and out of it".  Later that afternoon, he developed increased WOB and complained of SOB again.  His SOB worsened to the point that he could barely speak. On EMS arrival, he had SpO2 of 85% on RA which improved after he was placed on NRB. In ED, he remained somnolent and had mild increased WOB; therefore, he was placed on BiPAP.  CXR revealed diffuse right sided infiltrate and lab work showed multiple metabolic derangements including Na 110. Daughter states pt is stubborn and does NOT want to go to ED. The reason he was brought in today was because he was out and obtunded. Per daughter, pt has NOT been eating for several days, not sure if he has been taking his meds.   SUBJECTIVE:  More dependent on BiPAP support today. Worsening hypoxia & secretions. More somnolent today with low BP.  REVIEW OF SYSTEMS:  Unable to obtain given altered mentation.  VITAL SIGNS: BP 88/61 mmHg  Pulse 65  Temp(Src) 97.5 F (36.4 C) (Axillary)  Resp 21  Ht 5\' 9"  (1.753 m)  Wt 173 lb 1 oz (78.5 kg)  BMI 25.54 kg/m2  SpO2 98%  HEMODYNAMICS: CVP:  [8 mmHg-11 mmHg] 11 mmHg  VENTILATOR SETTINGS: Vent Mode:  [-] BIPAP FiO2 (%):  [4 %-50 %] 40 % Set Rate:  [8 bmp-15 bmp] 8 bmp PEEP:  [5 cmH20] 5 cmH20  INTAKE /  OUTPUT: I/O last 3 completed shifts: In: 7512.4 [I.V.:6096.2; IV Piggyback:1416.2] Out: 2245 [Urine:2245]     PHYSICAL EXAMINATION: General: Elderly male. No distress. Somewhat sedate. BiPAP in place. Neuro:  Spontaneously moves all 4 extremities. Nods to questions but very drowsy. Not consistently following commands. HEENT: BiPAP mask in place. No scleral icterus.  Cardiovascular: Irregular rhythm. A fib on telemetry. Regular rate. No edema. Lungs: Diminished right breath sounds worsening. Normal work of breathing on BiPAP.  Abdomen: Soft. Normal bowel sounds. Nondistended. Integument:  Warm & dry. No rash on exposed skin.  LABS:  BMET  Recent Labs Lab 05/30/15 2200 05/31/15 0150 05/31/15 0455  NA 120* 119* 121*  K 4.3 4.2 4.1  CL 86* 86* 88*  CO2 26 25 26   BUN 5* 5* <5*  CREATININE 0.52* 0.43* 0.50*  GLUCOSE 107* 125* 117*    Electrolytes  Recent Labs Lab 05/30/15 0440  05/30/15 2200 05/31/15 0150 05/31/15 0455 05/31/15 0509  CALCIUM 8.0*  < > 8.0* 8.1* 8.0*  --   MG 1.8  --   --   --   --  1.8  PHOS 2.6  --   --   --  1.8*  --   < > = values in this interval not displayed.  CBC  Recent Labs Lab 05/17/2015 1620 05/30/15 0440 05/31/15 [id number]  WBC 16.5* 11.0* 12.1*  HGB 14.8 11.6* 10.9*  HCT 43.4 33.4* 32.2*  PLT 286 256 198    Coag's  Recent Labs Lab 07-Jun-2015 1620  INR 1.35    Sepsis Markers  Recent Labs Lab 06/07/15 1638 Jun 07, 2015 2045 06/07/15 2151 05/30/15 0005 05/30/15 0440 05/31/15 0509  LATICACIDVEN 3.92* 2.30*  --  1.5  --   --   PROCALCITON  --   --  0.19  --  0.22 0.12    ABG  Recent Labs Lab 06-07-2015 1553  PHART 7.320*  PCO2ART 50.9*  PO2ART 165.0*    Liver Enzymes  Recent Labs Lab Jun 07, 2015 1620 05/31/15 0455  AST 49*  --   ALT 28  --   ALKPHOS 177*  --   BILITOT 1.6*  --   ALBUMIN 2.9* 2.1*    Cardiac Enzymes  Recent Labs Lab 05/30/15 1140 05/30/15 1649 05/30/15 2200  TROPONINI <0.03 <0.03 <0.03      Glucose  Recent Labs Lab 06/07/2015 1656 06/07/15 2339 05/30/15 1133 05/30/15 1525  GLUCAP 105* 110* 95 116*    Imaging No results found.   STUDIES:  CXR 05/25 > diffuse right sided infiltrate with ? Pleural thickening and pleural effusion. CT head 05/25 > no acute process.  Widespread abnormal lucency throughout the posterior left parietal lobe and central parietal bone, DDx includes malignant soft tissue mass with osseous involvement and metastatic disease.   Port CXR 5/25:  Right lung opacification. Left CVL w/ tip in SVC. TTE 05/26:  LV EF 40-45% w/ diffuse hypokinesis. RV mildly dilated w/ severely reduced systolic function. PASP 43 mmHg.  MICROBIOLOGY: Blood 05/25 > Urine 05/25 > MRSA PCR 5/25:  Negative  Urine Strep Ag 5/25: Negative Urine Legionella Ag 5/25 >  ANTIBIOTICS: Vanc 5/25 - 5/26 Zosyn 5/25 - 5/26 Doxycycline 5/26 >>  SIGNIFICANT EVENTS: 05/25 > admitted with acute hypoxic respiratory failure, CAP, AMS, hyponatremia.  LINES/TUBES: L IJ CVL 5/25 >> Foley 5/25 >> PIV x2  ASSESSMENT / PLAN:  RENAL A:   Hyponatremia - AMS (110). Corrected from 116 to 121 over last 24 hours. Pseudohypocalcemia - corrects to 9.18.  P:   Hold NS for now Trending electrolytes q4hr Monitoring UOP Ionized Calcium pending  NEUROLOGIC A:   Acute Encephalopathy - Now worsening. Multifactorial. Likely due to hypoxia, hypercarbia, & hyponatremia. Soft Tissue Mass on Head CT H/O TIA, seizures, DDD, RLS  P:   Holding on MRI given waxing & waning mentation Precedex gtt Hold outpatient plavix, phenytoin. Hold outpatient diazepam, morphine, pregabalin, quetiapine, ropinirole.  PULMONARY A: Acute Hypoxic Respiratory Failure Acute Hypercarbic Respiratory Failure Possible Right CAP Right Pleural Effusion - Unable to safely do thoracentesis. H/O COPD H/O Tobacco Use  P:   BiPAP  Xopenex q6hr Atrovent q6hr Tobacco cessation counseling if patient  improves Checking thoracic ultrasound for possible thoracentesis   CARDIOVASCULAR A:  A.fib with RVR - New onset. Borderline Hypotension H/O HTN.  P:  Monitor on telemetry Vitals per unit protocol Continue Heparin gtt per pharmacy protocol Continue outpatient plavix. Hold outpatient furosemide, losartan. Continue Amiodarone gtt   INFECTIOUS A:   Possible Sepsis Possible Right CAP  P:   Switching to Empiric Doxycycline Awaiting culture results PCT per algorithm  GASTROINTESTINAL A:   H/O GERD  P:   NPO Protonix IV daily  HEMATOLOGIC A:   No acute issues.  P:  Heparin gtt. Trending cell counts daily w/ CBC  ENDOCRINE A:   No acute issues.  P:  Monitor blood glucose electrolyte panel.  Family Updates: Daughter, sister, father, and brother updated 5/26 by Dr. Jamison Neighbor.  Interdisciplinary Family Meeting v Palliative Care Meeting:  Due by: 06/04/15.  TODAY'S SUMMARY:  65 y.o. M admitted 05/25 with acute hypoxic respiratory failure, CAP, AMS, hyponatremia. I'm doubtful that this represents an infectious process. Suspect the patient has underlying malignancy contributing to findings on his chest x-ray. Thoracentesis cannot be safely performed given the patient's waxing and waning mentation. Continued respiratory status decline likely secondary to some element of congestive heart failure therefore discontinuing IV fluid. Plan to discuss goals of care further with family once they are available today.  I have spent a total of 32 minutes of critical care time today caring for the patient, updating the patient's family at bedside, and reviewing the patient's electronic medical record.  Donna Christen Jamison Neighbor, M.D. [facility] Pulmonary & Critical Care Pager:  [phone] After 3pm or if no response, call [phone] 8:01 AM 05/31/2015

## 2015-06-01 DIAGNOSIS — J189 Pneumonia, unspecified organism: Secondary | ICD-10-CM

## 2015-06-01 DIAGNOSIS — I4891 Unspecified atrial fibrillation: Secondary | ICD-10-CM

## 2015-06-01 LAB — RENAL FUNCTION PANEL
ANION GAP: 8 (ref 5–15)
Albumin: 2 g/dL — ABNORMAL LOW (ref 3.5–5.0)
BUN: 6 mg/dL (ref 6–20)
CHLORIDE: 92 mmol/L — AB (ref 101–111)
CO2: 24 mmol/L (ref 22–32)
CREATININE: 0.5 mg/dL — AB (ref 0.61–1.24)
Calcium: 8.5 mg/dL — ABNORMAL LOW (ref 8.9–10.3)
Glucose, Bld: 118 mg/dL — ABNORMAL HIGH (ref 65–99)
Phosphorus: 1.8 mg/dL — ABNORMAL LOW (ref 2.5–4.6)
Potassium: 4.3 mmol/L (ref 3.5–5.1)
Sodium: 124 mmol/L — ABNORMAL LOW (ref 135–145)

## 2015-06-01 LAB — BASIC METABOLIC PANEL
Anion gap: 7 (ref 5–15)
CHLORIDE: 92 mmol/L — AB (ref 101–111)
CO2: 24 mmol/L (ref 22–32)
Calcium: 8.2 mg/dL — ABNORMAL LOW (ref 8.9–10.3)
Creatinine, Ser: 0.47 mg/dL — ABNORMAL LOW (ref 0.61–1.24)
GFR calc Af Amer: >60 mL/min (ref >60)
GFR calc non Af Amer: >60 mL/min (ref >60)
GLUCOSE: 126 mg/dL — AB (ref 65–99)
POTASSIUM: 4.1 mmol/L (ref 3.5–5.1)
SODIUM: 123 mmol/L — AB (ref 135–145)

## 2015-06-01 LAB — CBC
HEMATOCRIT: 33.1 % — AB (ref 39.0–52.0)
HEMOGLOBIN: 10.9 g/dL — AB (ref 13.0–17.0)
MCH: 28.5 pg (ref 26.0–34.0)
MCHC: 32.9 g/dL (ref 30.0–36.0)
MCV: 86.6 fL (ref 78.0–100.0)
Platelets: 200 10*3/uL (ref 150–400)
RBC: 3.82 MIL/uL — AB (ref 4.22–5.81)
RDW: 13.9 % (ref 11.5–15.5)
WBC: 11.2 10*3/uL — ABNORMAL HIGH (ref 4.0–10.5)

## 2015-06-01 LAB — HEPARIN LEVEL (UNFRACTIONATED)
HEPARIN UNFRACTIONATED: 0.16 [IU]/mL — AB (ref 0.30–0.70)
Heparin Unfractionated: 0.35 IU/mL (ref 0.30–0.70)

## 2015-06-01 LAB — MAGNESIUM: MAGNESIUM: 1.7 mg/dL (ref 1.7–2.4)

## 2015-06-01 MED ORDER — MORPHINE BOLUS VIA INFUSION
5.0000 mg | INTRAVENOUS | Status: DC | PRN
  Administered 2015-06-01: 10 mg via INTRAVENOUS
  Filled 2015-06-01 (×2): qty 20

## 2015-06-01 MED ORDER — HEPARIN BOLUS VIA INFUSION
2000.0000 [IU] | Freq: Once | INTRAVENOUS | Status: AC
  Administered 2015-06-01: 2000 [IU] via INTRAVENOUS
  Filled 2015-06-01: qty 2000

## 2015-06-01 MED ORDER — AMIODARONE HCL IN DEXTROSE 360-4.14 MG/200ML-% IV SOLN
30.0000 mg/h | INTRAVENOUS | Status: DC
  Filled 2015-06-01: qty 200

## 2015-06-01 MED ORDER — MORPHINE SULFATE 25 MG/ML IV SOLN
10.0000 mg/h | INTRAVENOUS | Status: DC
  Administered 2015-06-01: 10 mg/h via INTRAVENOUS
  Filled 2015-06-01: qty 10

## 2015-06-03 ENCOUNTER — Telehealth: Payer: Self-pay

## 2015-06-03 LAB — CULTURE, BLOOD (ROUTINE X 2)
CULTURE: NO GROWTH
Culture: NO GROWTH

## 2015-06-03 NOTE — Telephone Encounter (Signed)
On 06/03/2015 I received a death certificate [facility] [employer] (original). The death certificate is for burial. The patient is a patient of [facility] [employer]. [facility] tomorrow am for signature. On 06/04/2015 I received the death certificate back from [facility] [place]. I got the death certificate ready and called the funeral home to let them know the death certificate is ready for pickup.

## 2015-06-05 NOTE — Progress Notes (Signed)
PCCM Attending Family Discussion Note:  Met with patient's daughter, sisters, & father today. All are in agreement he would not want to undergo endotracheal intubation for any reason even diagnostic. They do not feel he would want to suffer any longer. They wish to transition to full comfort care. I am ordering a Morphine drip for pain control & relief of dyspnea. We will discontinue his heparin drip but continue his Amiodarone drip. We'll wean his Precedex drip once his Morphine drip is started. All understand he will pass and are appreciative of our efforts. I offered hospital chaplain and family have another coming. Patient will be full DNR/DNI. I offered my sympathies.  Sonia Baller Ashok Cordia, M.D. [facility] Pulmonary & Critical Care Pager:  [phone] After 3pm or if no response, call [phone] 11:23 AM 2015/06/13

## 2015-06-05 NOTE — Progress Notes (Signed)
Attempted to take pt off Bipap. Pt is SOB & wheezing. Placed back on Bipap

## 2015-06-05 NOTE — Progress Notes (Addendum)
ANTICOAGULATION CONSULT NOTE  Pharmacy Consult for heparin Indication: atrial fibrillation  No Known Allergies  Patient Measurements: Height: 5\' 9"  (175.3 cm) Weight: 173 lb 1 oz (78.5 kg) IBW/kg (Calculated) : 70.7 Heparin Dosing Weight: 76 kg  Vital Signs: Temp: 97.7 F (36.5 C) (05/27 2338) Temp Source: Axillary (05/27 2338) BP: 99/65 mmHg (05/27 2330) Pulse Rate: 61 (05/27 2330)  Labs:  Recent Labs  05/09/2015 1620  05/30/15 0440  05/30/15 1140  05/30/15 1649  05/30/15 2200  05/31/15 0455 05/31/15 0509 05/31/15 1000 05/31/15 1518 05/31/15 1813 05/31/15 2353  HGB 14.8  --  11.6*  --   --   --   --   --   --   --   --  10.9*  --   --   --   --   HCT 43.4  --  33.4*  --   --   --   --   --   --   --   --  32.2*  --   --   --   --   PLT 286  --  256  --   --   --   --   --   --   --   --  198  --   --   --   --   LABPROT 16.8*  --   --   --   --   --   --   --   --   --   --   --   --   --   --   --   INR 1.35  --   --   --   --   --   --   --   --   --   --   --   --   --   --   --   HEPARINUNFRC  --   --   --   < >  --   --   --   --   --   --  0.23*  --   --  0.15*  --  0.35  CREATININE 0.59*  < > 0.54*  < > 0.51*  < >  --   < > 0.52*  < > 0.50*  --  0.43* 0.52* 0.46*  --   TROPONINI  --   --   --   --  <0.03  --  <0.03  --  <0.03  --   --   --   --   --   --   --   < > = values in this interval not displayed.  Estimated Creatinine Clearance: 90.8 mL/min (by C-G formula based on Cr of 0.46).  Assessment: [age] yoM admitted with severe respiratory distress and  concern for PNA.   PMHx: COPD, TIA, Seizures, HTN, OA  Anticoagulation: heparin for AFib (CHADSVASc at least 4) On heparin at 1450 units/hr, HL = 0.15  Repeat HL is now therapeutic at 0.35 on heparin 1600 units/hr. No issues with infusion or bleeding noted.  Goal of Therapy:  Heparin level 0.3-0.7 units/ml Monitor platelets by anticoagulation protocol: Yes   Plan:  Continue heparin 1600 units/hr 6h  confirmatory HL Daily CBC, HL  Arlean Hoppingorey M. Newman PiesBall, PharmD, BCPS Clinical Pharmacist Pager [phone]  May 10, 2015 12:11 AM   ADDN: Repeat HL is now subtherapeutic at 0.16 on heparin 1600 units/hr. Nurse reports no issues with infusion or bleeding.  Plan: Bolus heparin 2000  units and increase rate to 1850 units/hr 6h HL   Arlean Hopping. Newman Pies, PharmD, BCPS Clinical Pharmacist Pager [phone]

## 2015-06-05 NOTE — Progress Notes (Signed)
Patient expired at 1550. A rhythm strip was printed showing asystole. Patient had no breath sounds or heart sounds upon auscultation. Patient had no palpable pulses. Pupils were fixed and nonreactive. This was verified by Juliette AlcideMelinda RN and myself. MD Jamison NeighborNestor, CDS, and Elink was notified in regards to patient's passing. Patient family was at beside during time of passing.

## 2015-06-05 NOTE — Discharge Summary (Signed)
DEATH NOTE: For complete accounting of the patient's history and physical exam on presentation please refer to the admission H&P on 2015/10/11. In brief patient was admitted on 2015/10/11 with known history of COPD and continuing tobacco use. Patient had reportedly had shortness of breath since 5/24 but repeatedly refused transport to local emergency department. Patient was noted on 5/25 to be more somnolent and have altered mentation by home aid. Patient developed progressively worsening shortness of breath and work of breathing to the point where he could barely speak. Patient was subsequently transported by EMS and on arrival was saturating 85% on room air. He was subsequently transitioned to BiPAP support from nonrebreather for increased work of breathing. Patient was noted to be hyponatremic to 110 on admission and have dense opacification of the right hemithorax. Patient was admitted to the intensive care unit and BiPAP support was continued. Patient was also found to be in atrial fibrillation with rapid ventricular response. He was subsequently started on IV amiodarone and a heparin infusion. Bedside thoracic ultrasound did show a small to moderate-sized right pleural effusion but given the patient's altered mentation and tenuous respiratory status thoracentesis could not be safely performed. Patient's hyponatremia was gradually corrected with IV fluids over the course of his admission and patient did have some mild improvement in his mentation but continued to have worsening respiratory status. Subsequently, IV fluids were discontinued. After family discussion today regarding continued slow decline in his respiratory status and worsening of his hypoxic respiratory failure family believed he would not wish to be maintained in his current state and elected to transition to full comfort care. At 15:50 PM on 05/17/2015 the patient succumbed to his critical illness with family at bedside per nursing report.  DIAGNOSES  AT DEATH: 1. Acute Hypercarbic Respiratory Failure 2. Acute Hypoxic Respiratory Failure 3. Acute on Chronic Systolic Congestive Heart Failure 4. Atrial Fibrillation with Rapid Ventricular Response 5. Hyponatremia 6. Acute Encephalopathy 7. Right Pleural Effusion 8. Right Lung Opacification 9. History of COPD 10. Ongoing Tobacco Use

## 2015-06-05 NOTE — Progress Notes (Signed)
Wasted 175mls of Morphine IV with Marica OtterMaria Withrow RN in sink.

## 2015-06-05 NOTE — Progress Notes (Signed)
PULMONARY / CRITICAL CARE MEDICINE   Name: Ray Green MRN: [id number] DOB: 1950/04/12    ADMISSION DATE:  06/18/2015 CONSULTATION DATE:  Jun 18, 2015  REFERRING MD:  EDP  CHIEF COMPLAINT:  AMS, SOB  HISTORY OF PRESENT ILLNESS:  Pt is encephelopathic; therefore, this HPI is obtained from chart review. Ray Green is a 65 y.o. male with PMH as outlined below including COPD, previously seen by Dr. Sherene Sires and who continues to smoke.  He had SOB the evening of 05/24 and EMS was dispatched; however, he refused to come to the hospital and felt that symptoms would resolve. The following day, 5/25, his home aid (who helps him with some daily chores) noted that he was more somnolent than usual and was "in and out of it".  Later that afternoon, he developed increased WOB and complained of SOB again.  His SOB worsened to the point that he could barely speak. On EMS arrival, he had SpO2 of 85% on RA which improved after he was placed on NRB. In ED, he remained somnolent and had mild increased WOB; therefore, he was placed on BiPAP.  CXR revealed diffuse right sided infiltrate and lab work showed multiple metabolic derangements including Na 110. Daughter states pt is stubborn and does NOT want to go to ED. The reason he was brought in today was because he was out and obtunded. Per daughter, pt has NOT been eating for several days, not sure if he has been taking his meds.   SUBJECTIVE:  Remained on BiPAP mostly over the last 24 hours. Attempting to take patient off worsened his mental status with lethargy. Patient still reporting difficulty breathing.   REVIEW OF SYSTEMS:  Unable to obtain given altered mentation.  VITAL SIGNS: BP 93/66 mmHg  Pulse 114  Temp(Src) 97.8 F (36.6 C) (Oral)  Resp 18  Ht 5\' 9"  (1.753 m)  Wt 180 lb 5.4 oz (81.8 kg)  BMI 26.62 kg/m2  SpO2 100%  HEMODYNAMICS: CVP:  [10 mmHg-20 mmHg] 10 mmHg  VENTILATOR SETTINGS: Vent Mode:  [-] BIPAP FiO2 (%):  [40 %] 40 % Set Rate:   [8 bmp] 8 bmp PEEP:  [5 cmH20] 5 cmH20  INTAKE / OUTPUT: I/O last 3 completed shifts: In: 6663.3 [I.V.:5396.8; IV Piggyback:1266.5] Out: 1440 [Urine:1440]     PHYSICAL EXAMINATION: General: Elderly male. No distress. Sedate. Neuro:  Spontaneously moves all 4 extremities. Nods to questions but very drowsy. Moves extremities on command. HEENT: BiPAP mask in place. No scleral icterus.  Cardiovascular: Irregular rhythm but regular rate. A fib on telemetry. No edema. Lungs: Diminished right breath sounds worsening. Normal work of breathing on BiPAP.  Abdomen: Soft. Normal bowel sounds. Nondistended. Integument:  Warm & dry. No rash on exposed skin.  LABS:  BMET  Recent Labs Lab 05/31/15 1813 05/31/15 2353 05/21/2015 0530  NA 122* 123* 124*  K 4.1 4.1 4.3  CL 90* 92* 92*  CO2 25 24 24   BUN 5* <5* 6  CREATININE 0.46* 0.47* 0.50*  GLUCOSE 109* 126* 118*    Electrolytes  Recent Labs Lab 05/30/15 0440  05/31/15 0455 05/31/15 0509  05/31/15 1813 05/31/15 2353 05/26/2015 0530  CALCIUM 8.0*  < > 8.0*  --   < > 8.2* 8.2* 8.5*  MG 1.8  --   --  1.8  --   --   --  1.7  PHOS 2.6  --  1.8*  --   --   --   --  1.8*  < > =  values in this interval not displayed.  CBC  Recent Labs Lab 05/30/15 0440 05/31/15 0509 06/02/2015 0530  WBC 11.0* 12.1* 11.2*  HGB 11.6* 10.9* 10.9*  HCT 33.4* 32.2* 33.1*  PLT 256 198 200    Coag's  Recent Labs Lab 05/05/2015 1620  INR 1.35    Sepsis Markers  Recent Labs Lab 05/19/2015 1638 05/20/2015 2045 05/16/2015 2151 05/30/15 0005 05/30/15 0440 05/31/15 0509  LATICACIDVEN 3.92* 2.30*  --  1.5  --   --   PROCALCITON  --   --  0.19  --  0.22 0.12    ABG  Recent Labs Lab 05/31/2015 1553  PHART 7.320*  PCO2ART 50.9*  PO2ART 165.0*    Liver Enzymes  Recent Labs Lab 05/23/2015 1620 05/31/15 0455 06/02/2015 0530  AST 49*  --   --   ALT 28  --   --   ALKPHOS 177*  --   --   BILITOT 1.6*  --   --   ALBUMIN 2.9* 2.1* 2.0*     Cardiac Enzymes  Recent Labs Lab 05/30/15 1140 05/30/15 1649 05/30/15 2200  TROPONINI <0.03 <0.03 <0.03    Glucose  Recent Labs Lab 05/09/2015 1656 05/23/2015 2339 05/30/15 1133 05/30/15 1525  GLUCAP 105* 110* 95 116*    Imaging No results found.   STUDIES:  CXR 05/25 > diffuse right sided infiltrate with ? Pleural thickening and pleural effusion. CT head 05/25 > no acute process.  Widespread abnormal lucency throughout the posterior left parietal lobe and central parietal bone, DDx includes malignant soft tissue mass with osseous involvement and metastatic disease.   Port CXR 5/25:  Right lung opacification. Left CVL w/ tip in SVC. TTE 05/26:  LV EF 40-45% w/ diffuse hypokinesis. RV mildly dilated w/ severely reduced systolic function. PASP 43 mmHg.  MICROBIOLOGY: Blood 05/25 > Urine 05/25 > MRSA PCR 5/25:  Negative  Urine Strep Ag 5/25: Negative Urine Legionella Ag 5/25 >  ANTIBIOTICS: Vanc 5/25 - 5/26 Zosyn 5/25 - 5/26 Doxycycline 5/26 >>  SIGNIFICANT EVENTS: 05/25 > admitted with acute hypoxic respiratory failure, CAP, AMS, hyponatremia.  LINES/TUBES: L IJ CVL 5/25 >> Foley 5/25 >> PIV x2  ASSESSMENT / PLAN:  RENAL A:   Hyponatremia - AMS (110). Corrected from 121 to 124 over last 24 hours. Pseudohypocalcemia - corrects to 9.18.  P:   Hold NS for now given tenuous respiratory status Trending electrolytes q4hr Monitoring UOP  NEUROLOGIC A:   Acute Encephalopathy - Fluctuating. Multifactorial. Likely due to hypoxia, hypercarbia, & hyponatremia. Soft Tissue Mass on Head CT H/O TIA, seizures, DDD, RLS  P:   Holding on MRI given waxing & waning mentation Precedex gtt Hold outpatient plavix, phenytoin. Hold outpatient diazepam, morphine, pregabalin, quetiapine, ropinirole.  PULMONARY A: Acute Hypoxic Respiratory Failure Acute Hypercarbic Respiratory Failure Possible Right CAP Right Pleural Effusion - Unable to safely do  thoracentesis. H/O COPD H/O Tobacco Use  P:   BiPAP  Xopenex q6hr Atrovent q6hr Tobacco cessation counseling if patient improves Checking thoracic ultrasound for possible thoracentesis   CARDIOVASCULAR A:  A.fib with RVR - New onset. Borderline Hypotension H/O HTN.  P:  Monitor on telemetry Vitals per unit protocol Continue Heparin gtt per pharmacy protocol Continue outpatient plavix. Hold outpatient furosemide, losartan. Continue Amiodarone gtt   INFECTIOUS A:   Possible Sepsis Possible Right CAP  P:   Empiric Doxycycline Day #3 Awaiting culture results PCT per algorithm  GASTROINTESTINAL A:   H/O GERD  P:   NPO  Protonix IV daily  HEMATOLOGIC A:   No acute issues.  P:  Heparin gtt. Trending cell counts daily w/ CBC  ENDOCRINE A:   No acute issues.  P:   Monitor blood glucose electrolyte panel.  Family Updates: Daughter, sister, father, and brother updated 5/26 by Dr. Jamison NeighborNestor. Unable to reach family as of yet 5/28.  Interdisciplinary Family Meeting v Palliative Care Meeting:  Due by: 06/04/15.  TODAY'S SUMMARY:  65 y.o. M admitted 05/25 with acute hypoxic respiratory failure, CAP, AMS, hyponatremia. I'm doubtful that this represents an infectious process. Suspect the patient has underlying malignancy contributing to findings on his chest x-ray. Thoracentesis cannot be safely performed given the patient's waxing and waning mentation. Respiratory status has become BiPAP dependent. Attempted to contact family to discuss goals of care and transitioning to full palliation depending on family wishes.   I have spent a total of 37 minutes of critical care time today caring for the patient, updating the patient's family at bedside, and reviewing the patient's electronic medical record.  Donna ChristenJennings E. Jamison NeighborNestor, M.D. [facility] Pulmonary & Critical Care Pager:  [phone] After 3pm or if no response, call [phone] 8:24 AM 05/03/2015

## 2015-06-05 NOTE — Progress Notes (Signed)
Patient was taken off of bipap and placed on nasal cannula per MD order for "Withdrawal of Life" guidelines.  Patient resting comfortably.

## 2015-06-05 DEATH — deceased

## 2015-07-15 ENCOUNTER — Ambulatory Visit: Payer: Medicare Other | Admitting: Internal Medicine
